# Patient Record
Sex: Female | Born: 1935
Health system: Southern US, Community
[De-identification: ages and names within clinical notes are randomized; demographics above are authoritative.]

## PROBLEM LIST (undated history)

## (undated) DIAGNOSIS — K279 Peptic ulcer, site unspecified, unspecified as acute or chronic, without hemorrhage or perforation: Secondary | ICD-10-CM

## (undated) DIAGNOSIS — M810 Age-related osteoporosis without current pathological fracture: Secondary | ICD-10-CM

## (undated) DIAGNOSIS — E079 Disorder of thyroid, unspecified: Secondary | ICD-10-CM

## (undated) DIAGNOSIS — E039 Hypothyroidism, unspecified: Secondary | ICD-10-CM

## (undated) DIAGNOSIS — R55 Syncope and collapse: Secondary | ICD-10-CM

## (undated) HISTORY — PX: HERNIA REPAIR: SHX51

---

## 1998-10-05 ENCOUNTER — Other Ambulatory Visit: Admission: RE | Admit: 1998-10-05 | Discharge: 1998-10-05 | Payer: Self-pay | Admitting: Obstetrics and Gynecology

## 2000-02-17 ENCOUNTER — Encounter: Payer: Self-pay | Admitting: Obstetrics and Gynecology

## 2000-02-17 ENCOUNTER — Encounter: Admission: RE | Admit: 2000-02-17 | Discharge: 2000-02-17 | Payer: Self-pay | Admitting: Obstetrics and Gynecology

## 2000-03-16 ENCOUNTER — Encounter (INDEPENDENT_AMBULATORY_CARE_PROVIDER_SITE_OTHER): Payer: Self-pay

## 2000-03-16 ENCOUNTER — Other Ambulatory Visit: Admission: RE | Admit: 2000-03-16 | Discharge: 2000-03-16 | Payer: Self-pay | Admitting: Obstetrics and Gynecology

## 2001-03-23 ENCOUNTER — Encounter: Payer: Self-pay | Admitting: Obstetrics and Gynecology

## 2001-03-23 ENCOUNTER — Other Ambulatory Visit: Admission: RE | Admit: 2001-03-23 | Discharge: 2001-03-23 | Payer: Self-pay | Admitting: Obstetrics and Gynecology

## 2001-03-23 ENCOUNTER — Encounter: Admission: RE | Admit: 2001-03-23 | Discharge: 2001-03-23 | Payer: Self-pay | Admitting: Obstetrics and Gynecology

## 2001-12-03 ENCOUNTER — Other Ambulatory Visit: Admission: RE | Admit: 2001-12-03 | Discharge: 2001-12-03 | Payer: Self-pay | Admitting: Obstetrics and Gynecology

## 2002-07-18 ENCOUNTER — Encounter: Payer: Self-pay | Admitting: Obstetrics and Gynecology

## 2002-07-18 ENCOUNTER — Encounter: Admission: RE | Admit: 2002-07-18 | Discharge: 2002-07-18 | Payer: Self-pay | Admitting: Obstetrics and Gynecology

## 2002-08-27 ENCOUNTER — Other Ambulatory Visit: Admission: RE | Admit: 2002-08-27 | Discharge: 2002-08-27 | Payer: Self-pay | Admitting: Obstetrics and Gynecology

## 2003-09-26 ENCOUNTER — Encounter: Admission: RE | Admit: 2003-09-26 | Discharge: 2003-09-26 | Payer: Self-pay | Admitting: Obstetrics and Gynecology

## 2003-09-26 ENCOUNTER — Encounter: Payer: Self-pay | Admitting: Obstetrics and Gynecology

## 2004-01-14 ENCOUNTER — Other Ambulatory Visit: Admission: RE | Admit: 2004-01-14 | Discharge: 2004-01-14 | Payer: Self-pay | Admitting: Obstetrics and Gynecology

## 2004-10-01 ENCOUNTER — Encounter: Admission: RE | Admit: 2004-10-01 | Discharge: 2004-10-01 | Payer: Self-pay | Admitting: Obstetrics and Gynecology

## 2005-12-02 ENCOUNTER — Encounter: Admission: RE | Admit: 2005-12-02 | Discharge: 2005-12-02 | Payer: Self-pay | Admitting: Obstetrics and Gynecology

## 2006-06-15 ENCOUNTER — Other Ambulatory Visit: Admission: RE | Admit: 2006-06-15 | Discharge: 2006-06-15 | Payer: Self-pay | Admitting: Obstetrics and Gynecology

## 2006-12-06 ENCOUNTER — Encounter: Admission: RE | Admit: 2006-12-06 | Discharge: 2006-12-06 | Payer: Self-pay | Admitting: Obstetrics and Gynecology

## 2007-12-10 ENCOUNTER — Encounter: Admission: RE | Admit: 2007-12-10 | Discharge: 2007-12-10 | Payer: Self-pay | Admitting: Obstetrics and Gynecology

## 2008-12-23 ENCOUNTER — Encounter: Admission: RE | Admit: 2008-12-23 | Discharge: 2008-12-23 | Payer: Self-pay | Admitting: Obstetrics and Gynecology

## 2009-12-28 ENCOUNTER — Encounter: Admission: RE | Admit: 2009-12-28 | Discharge: 2009-12-28 | Payer: Self-pay | Admitting: Obstetrics and Gynecology

## 2011-01-06 ENCOUNTER — Encounter
Admission: RE | Admit: 2011-01-06 | Discharge: 2011-01-06 | Payer: Self-pay | Source: Home / Self Care | Attending: Obstetrics and Gynecology | Admitting: Obstetrics and Gynecology

## 2012-01-03 ENCOUNTER — Other Ambulatory Visit: Payer: Self-pay | Admitting: Obstetrics and Gynecology

## 2012-01-03 DIAGNOSIS — Z1231 Encounter for screening mammogram for malignant neoplasm of breast: Secondary | ICD-10-CM

## 2012-01-25 ENCOUNTER — Ambulatory Visit
Admission: RE | Admit: 2012-01-25 | Discharge: 2012-01-25 | Disposition: A | Discharge status: Home / Self Care | Payer: Medicare Other | Source: Ambulatory Visit | Attending: Obstetrics and Gynecology | Admitting: Obstetrics and Gynecology

## 2012-01-25 DIAGNOSIS — Z1231 Encounter for screening mammogram for malignant neoplasm of breast: Secondary | ICD-10-CM

## 2013-05-02 ENCOUNTER — Other Ambulatory Visit: Payer: Self-pay

## 2013-05-02 DIAGNOSIS — Z1231 Encounter for screening mammogram for malignant neoplasm of breast: Secondary | ICD-10-CM

## 2013-05-17 ENCOUNTER — Ambulatory Visit: Payer: Medicare Other

## 2013-06-03 ENCOUNTER — Ambulatory Visit
Admission: RE | Admit: 2013-06-03 | Discharge: 2013-06-03 | Disposition: A | Discharge status: Home / Self Care | Payer: Medicare Other | Source: Ambulatory Visit

## 2013-06-03 DIAGNOSIS — Z1231 Encounter for screening mammogram for malignant neoplasm of breast: Secondary | ICD-10-CM

## 2014-05-06 ENCOUNTER — Other Ambulatory Visit: Payer: Self-pay

## 2014-05-06 DIAGNOSIS — Z1231 Encounter for screening mammogram for malignant neoplasm of breast: Secondary | ICD-10-CM

## 2014-06-09 ENCOUNTER — Ambulatory Visit
Admission: RE | Admit: 2014-06-09 | Discharge: 2014-06-09 | Disposition: A | Discharge status: Home / Self Care | Payer: Medicare Other | Source: Ambulatory Visit

## 2014-06-09 ENCOUNTER — Encounter (INDEPENDENT_AMBULATORY_CARE_PROVIDER_SITE_OTHER): Payer: Self-pay

## 2014-06-09 DIAGNOSIS — Z1231 Encounter for screening mammogram for malignant neoplasm of breast: Secondary | ICD-10-CM

## 2015-06-02 ENCOUNTER — Other Ambulatory Visit: Payer: Self-pay

## 2015-06-02 DIAGNOSIS — Z1231 Encounter for screening mammogram for malignant neoplasm of breast: Secondary | ICD-10-CM

## 2015-07-06 ENCOUNTER — Ambulatory Visit
Admission: RE | Admit: 2015-07-06 | Discharge: 2015-07-06 | Disposition: A | Discharge status: Home / Self Care | Payer: Medicare Other | Source: Ambulatory Visit

## 2015-07-06 DIAGNOSIS — Z1231 Encounter for screening mammogram for malignant neoplasm of breast: Secondary | ICD-10-CM

## 2016-07-01 ENCOUNTER — Other Ambulatory Visit: Payer: Self-pay | Admitting: Registered Nurse

## 2016-07-01 ENCOUNTER — Other Ambulatory Visit: Payer: Self-pay | Admitting: Obstetrics & Gynecology

## 2016-07-01 DIAGNOSIS — Z1231 Encounter for screening mammogram for malignant neoplasm of breast: Secondary | ICD-10-CM

## 2016-08-02 ENCOUNTER — Ambulatory Visit
Admission: RE | Admit: 2016-08-02 | Discharge: 2016-08-02 | Disposition: A | Discharge status: Home / Self Care | Payer: Medicare Other | Source: Ambulatory Visit | Attending: Registered Nurse | Admitting: Registered Nurse

## 2016-08-02 DIAGNOSIS — Z1231 Encounter for screening mammogram for malignant neoplasm of breast: Secondary | ICD-10-CM

## 2016-11-03 ENCOUNTER — Emergency Department (HOSPITAL_COMMUNITY): Payer: Medicare Other

## 2016-11-03 ENCOUNTER — Emergency Department (HOSPITAL_COMMUNITY)
Admission: EM | Admit: 2016-11-03 | Discharge: 2016-11-03 | Disposition: A | Discharge status: Home / Self Care | Payer: Medicare Other | Attending: Emergency Medicine | Admitting: Emergency Medicine

## 2016-11-03 ENCOUNTER — Encounter (HOSPITAL_COMMUNITY): Payer: Self-pay

## 2016-11-03 DIAGNOSIS — R55 Syncope and collapse: Secondary | ICD-10-CM | POA: Insufficient documentation

## 2016-11-03 HISTORY — DX: Disorder of thyroid, unspecified: E07.9

## 2016-11-03 LAB — URINALYSIS, ROUTINE W REFLEX MICROSCOPIC
BILIRUBIN URINE: NEGATIVE
GLUCOSE, UA: NEGATIVE mg/dL
HGB URINE DIPSTICK: NEGATIVE
Ketones, ur: 15 mg/dL — AB
Leukocytes, UA: NEGATIVE
Nitrite: NEGATIVE
Protein, ur: NEGATIVE mg/dL
SPECIFIC GRAVITY, URINE: 1.01 (ref 1.005–1.030)
pH: 6.5 (ref 5.0–8.0)

## 2016-11-03 LAB — CBC WITH DIFFERENTIAL/PLATELET
BASOS ABS: 0.1 10*3/uL (ref 0.0–0.1)
Basophils Relative: 1 %
Eosinophils Absolute: 0.2 10*3/uL (ref 0.0–0.7)
Eosinophils Relative: 3 %
HEMATOCRIT: 39.7 % (ref 36.0–46.0)
HEMOGLOBIN: 13.6 g/dL (ref 12.0–15.0)
LYMPHS PCT: 39 %
Lymphs Abs: 2.4 10*3/uL (ref 0.7–4.0)
MCH: 32.2 pg (ref 26.0–34.0)
MCHC: 34.3 g/dL (ref 30.0–36.0)
MCV: 93.9 fL (ref 78.0–100.0)
Monocytes Absolute: 0.4 10*3/uL (ref 0.1–1.0)
Monocytes Relative: 7 %
NEUTROS ABS: 3.1 10*3/uL (ref 1.7–7.7)
NEUTROS PCT: 50 %
Platelets: 204 10*3/uL (ref 150–400)
RBC: 4.23 MIL/uL (ref 3.87–5.11)
RDW: 13.3 % (ref 11.5–15.5)
WBC: 6.1 10*3/uL (ref 4.0–10.5)

## 2016-11-03 LAB — COMPREHENSIVE METABOLIC PANEL
ALT: 22 U/L (ref 14–54)
AST: 39 U/L (ref 15–41)
Albumin: 3.7 g/dL (ref 3.5–5.0)
Alkaline Phosphatase: 39 U/L (ref 38–126)
Anion gap: 9 (ref 5–15)
BILIRUBIN TOTAL: 0.8 mg/dL (ref 0.3–1.2)
BUN: 14 mg/dL (ref 6–20)
CO2: 22 mmol/L (ref 22–32)
CREATININE: 0.71 mg/dL (ref 0.44–1.00)
Calcium: 9.6 mg/dL (ref 8.9–10.3)
Chloride: 104 mmol/L (ref 101–111)
GFR calc Af Amer: >60 mL/min (ref >60)
GLUCOSE: 125 mg/dL — AB (ref 65–99)
Potassium: 3.7 mmol/L (ref 3.5–5.1)
Sodium: 135 mmol/L (ref 135–145)
TOTAL PROTEIN: 6.2 g/dL — AB (ref 6.5–8.1)

## 2016-11-03 LAB — CBG MONITORING, ED: GLUCOSE-CAPILLARY: 105 mg/dL — AB (ref 65–99)

## 2016-11-03 MED ORDER — SODIUM CHLORIDE 0.9 % IV BOLUS (SEPSIS)
1000.0000 mL | Freq: Once | INTRAVENOUS | Status: AC
  Administered 2016-11-03: 1000 mL via INTRAVENOUS

## 2016-11-03 NOTE — ED Notes (Signed)
Fluids placed on pump because pt intermittently cuts off fluids with movement.

## 2016-11-03 NOTE — ED Triage Notes (Signed)
Pt arrives EMS from church where she was helping with meal. Pt became hot and nauseated, sat down and passed out. Loss of continence. Pt c/o continued nausea but denies pain.

## 2016-11-03 NOTE — ED Provider Notes (Signed)
[facility]-EMERGENCY DEPT Provider Note   CSN: [id number] Arrival date & time: 11/03/16  1027     History   Chief Complaint Chief Complaint  Patient presents with  . Loss of Consciousness    HPI Cheryl Schneider is a 80 y.o. female.   Loss of Consciousness   This is a recurrent problem. The current episode started less than 1 hour ago. The problem occurs rarely. The problem has been resolved. She lost consciousness for a period of less than one minute. The problem is associated with standing up (heat). Associated symptoms include diaphoresis, light-headedness and nausea. Pertinent negatives include chest pain, confusion, dizziness, headaches, palpitations, seizures, visual change and weakness. She has tried nothing for the symptoms.    Past Medical History:  Diagnosis Date  . Thyroid disease     There are no active problems to display for this patient.   Past Surgical History:  Procedure Laterality Date  . HERNIA REPAIR      OB History    No data available       Home Medications    Prior to Admission medications   Medication Sig Start Date End Date Taking? Authorizing Provider  calcium-vitamin D (OSCAL WITH D) 500-200 MG-UNIT tablet Take 3 tablets by mouth daily with breakfast.   Yes Historical Provider, MD  Coenzyme Q10 (COQ-10) 10 MG CAPS Take 1 tablet by mouth 2 (two) times daily.   Yes Historical Provider, MD  cyanocobalamin 1000 MCG tablet Take 1,000 mcg by mouth daily.   Yes Historical Provider, MD  Garlic 100 MG TABS Take 100 mg by mouth daily.   Yes Historical Provider, MD  LEVOTHYROXINE SODIUM PO Take by mouth. Dose unknown. Pharmacy closed due to holiday.   Yes Historical Provider, MD  Multiple Vitamins-Minerals (MULTIVITAMIN WITH MINERALS) tablet Take 2 tablets by mouth daily.   Yes Historical Provider, MD  Omega-3 Fatty Acids (FISH OIL) 1000 MG CPDR Take 1,000 mg by mouth daily.   Yes Historical Provider, MD    Family History No family history on  file.  Social History Social History  Substance Use Topics  . Smoking status: Never Smoker  . Smokeless tobacco: Never Used  . Alcohol use No     Allergies   Patient has no known allergies.   Review of Systems Review of Systems  Constitutional: Positive for diaphoresis.  Respiratory: Negative for cough and shortness of breath.   Cardiovascular: Positive for syncope. Negative for chest pain and palpitations.  Gastrointestinal: Positive for nausea.  Neurological: Positive for light-headedness. Negative for dizziness, seizures, speech difficulty, weakness, numbness and headaches.  Psychiatric/Behavioral: Negative for confusion.  All other systems reviewed and are negative.    Physical Exam Updated Vital Signs BP 134/58   Pulse 71   Temp 98 F (36.7 C) (Oral)   Resp 14   Ht 5\' 5"  (1.651 m)   Wt 173 lb (78.5 kg)   SpO2 100%   BMI 28.79 kg/m   Physical Exam  Constitutional: She is oriented to person, place, and time. She appears well-developed and well-nourished.  HENT:  Head: Normocephalic and atraumatic.  Neck: Normal range of motion.  Cardiovascular: Normal rate and regular rhythm.  Exam reveals no gallop and no friction rub.   No murmur heard. Pulmonary/Chest: No stridor. No respiratory distress.  Abdominal: Soft. She exhibits no distension. There is no tenderness.  Musculoskeletal: Normal range of motion. She exhibits no edema or deformity.  Neurological: She is alert and oriented to person, place, and  time.  No altered mental status, able to give full seemingly accurate history.  Face is symmetric, EOM's intact, pupils equal and reactive, vision intact, tongue and uvula midline without deviation Upper and Lower extremity motor 5/5, intact pain perception in distal extremities, 2+ reflexes in biceps, patella and achilles tendons. Finger to nose normal, heel to shin normal. Walks without assistance or evident ataxia.   Skin: Skin is warm and dry.  Nursing note and  vitals reviewed.    ED Treatments / Results  Labs (all labs ordered are listed, but only abnormal results are displayed) Labs Reviewed  COMPREHENSIVE METABOLIC PANEL - Abnormal; Notable for the following:       Result Value   Glucose, Bld 125 (*)    Total Protein 6.2 (*)    All other components within normal limits  URINALYSIS, ROUTINE W REFLEX MICROSCOPIC (NOT AT [facility]) - Abnormal; Notable for the following:    Ketones, ur 15 (*)    All other components within normal limits  CBG MONITORING, ED - Abnormal; Notable for the following:    Glucose-Capillary 105 (*)    All other components within normal limits  CBC WITH DIFFERENTIAL/PLATELET  POCT CBG (FASTING - GLUCOSE)-MANUAL ENTRY    EKG  EKG Interpretation  Date/Time:  Thursday November 03 2016 10:36:00 EST Ventricular Rate:  67 PR Interval:    QRS Duration: 96 QT Interval:  403 QTC Calculation: 426 R Axis:   42 Text Interpretation:  Sinus rhythm No old tracing to compare Confirmed by [facility] Hospital[facility]ES[facility] MD, Sohail Capraro [phone]([id number]) on 11/03/2016 10:39:39 AM       Radiology Dg Chest 2 View  Result Date: 11/03/2016 CLINICAL DATA:  Syncopal episode.  Weakness. EXAM: CHEST  2 VIEW COMPARISON:  None. FINDINGS: Normal cardiac silhouette and mediastinal contours. Evaluation of the retrosternal clear space obscured secondary to overlying soft tissues. No focal airspace opacities. No pleural effusion or pneumothorax. No evidence of edema. No acute osseus abnormalities. IMPRESSION: No acute cardiopulmonary disease. Electronically Signed   By: Simonne ComeJohn  Watts M.D.   On: 11/03/2016 11:16   Ct Head Wo Contrast  Result Date: 11/03/2016 CLINICAL DATA:  Syncopal episode today. Denies hitting her head, states she sat down before passing out. No complaint of pain, just having nausea EXAM: CT HEAD WITHOUT CONTRAST TECHNIQUE: Contiguous axial images were obtained from the base of the skull through the vertex without intravenous contrast. COMPARISON:  None. FINDINGS:  Brain: No acute intracranial hemorrhage. No focal mass lesion. No CT evidence of acute infarction. No midline shift or mass effect. No hydrocephalus. Basilar cisterns are patent. Vascular: No hyperdense vessel or unexpected calcification. Skull: Normal. Negative for fracture or focal lesion. Sinuses/Orbits: No acute finding. Other: None. IMPRESSION: No acute intracranial findings.  No evidence trauma. Electronically Signed   By: Genevive BiStewart  Edmunds M.D.   On: 11/03/2016 11:21    Procedures Procedures (including critical care time)  Medications Ordered in ED Medications  sodium chloride 0.9 % bolus 1,000 mL (0 mLs Intravenous Stopped 11/03/16 1449)  sodium chloride 0.9 % bolus 1,000 mL (0 mLs Intravenous Stopped 11/03/16 1506)     Initial Impression / Assessment and Plan / ED Course  I have reviewed the triage vital signs and the nursing notes.  Pertinent labs & imaging results that were available during my care of the patient were reviewed by me and considered in my medical decision making (see chart for details).  Clinical Course     80 yo F presented with syncope lasting approximately  a minute and associated with light headedness, sweating while working in a kitchen. Pertinent physical exam findings include orthostatic symptoms and pertinent labs and imaging revealed no significant findings . This is most consistent with hypovolemic and vasovagal syncopal episode. Also on the differential diagnosis was seizure, TIA/Stroke, hypoglycemia, narcolepsy. Doubt the patient had a seizure without a history of seizure, no post-ictal state or incontinence. history not consistent with stroke or TIA. Hypoglycemia unlikely as patient improved without intervention and had normal glucose. For syncope, there are many possible causes however based on history and physical I feel like cardiac causes are unlikely with normal ECG (as read above), no new murmurs and no evidence of arrhythmias while on prolonged (4  hours) continuous monitoring here. Patient not hypotensive here and upon review of the records there are no drugs/medical problems that would point toward this as a cause. Unsure of actual cause of syncope, however I think it is most consistent with a vasovagal (and hypovolemic) type of syncope and emergent causes are unlikely at this time and they can continue workup as an outpatient with PCP.   Final Clinical Impressions(s) / ED Diagnoses   Final diagnoses:  Syncope and collapse    New Prescriptions Discharge Medication List as of 11/03/2016  2:56 PM       Marily MemosJason Stepanie Graver, MD 11/05/16 [phone]

## 2016-11-03 NOTE — ED Notes (Signed)
Pt ambulated good with a normal gait.

## 2017-07-24 ENCOUNTER — Other Ambulatory Visit: Payer: Self-pay | Admitting: Registered Nurse

## 2017-07-24 DIAGNOSIS — Z1231 Encounter for screening mammogram for malignant neoplasm of breast: Secondary | ICD-10-CM

## 2017-08-04 ENCOUNTER — Ambulatory Visit
Admission: RE | Admit: 2017-08-04 | Discharge: 2017-08-04 | Disposition: A | Discharge status: Home / Self Care | Payer: Medicare Other | Source: Ambulatory Visit | Attending: Registered Nurse | Admitting: Registered Nurse

## 2017-08-04 DIAGNOSIS — Z1231 Encounter for screening mammogram for malignant neoplasm of breast: Secondary | ICD-10-CM

## 2018-05-01 ENCOUNTER — Inpatient Hospital Stay (HOSPITAL_COMMUNITY)
Admission: EM | Admit: 2018-05-01 | Discharge: 2018-05-03 | DRG: 378 | Disposition: A | Discharge status: Home / Self Care | Payer: Medicare Other | Attending: Internal Medicine | Admitting: Internal Medicine

## 2018-05-01 ENCOUNTER — Encounter (HOSPITAL_COMMUNITY): Payer: Self-pay | Admitting: Emergency Medicine

## 2018-05-01 ENCOUNTER — Other Ambulatory Visit: Payer: Self-pay

## 2018-05-01 DIAGNOSIS — K279 Peptic ulcer, site unspecified, unspecified as acute or chronic, without hemorrhage or perforation: Secondary | ICD-10-CM | POA: Diagnosis not present

## 2018-05-01 DIAGNOSIS — D62 Acute posthemorrhagic anemia: Secondary | ICD-10-CM | POA: Diagnosis present

## 2018-05-01 DIAGNOSIS — K2971 Gastritis, unspecified, with bleeding: Secondary | ICD-10-CM | POA: Diagnosis not present

## 2018-05-01 DIAGNOSIS — D649 Anemia, unspecified: Secondary | ICD-10-CM

## 2018-05-01 DIAGNOSIS — M810 Age-related osteoporosis without current pathological fracture: Secondary | ICD-10-CM

## 2018-05-01 DIAGNOSIS — R55 Syncope and collapse: Secondary | ICD-10-CM | POA: Diagnosis present

## 2018-05-01 DIAGNOSIS — Z7983 Long term (current) use of bisphosphonates: Secondary | ICD-10-CM

## 2018-05-01 DIAGNOSIS — K2901 Acute gastritis with bleeding: Secondary | ICD-10-CM | POA: Diagnosis present

## 2018-05-01 DIAGNOSIS — Z79890 Hormone replacement therapy (postmenopausal): Secondary | ICD-10-CM

## 2018-05-01 DIAGNOSIS — K922 Gastrointestinal hemorrhage, unspecified: Secondary | ICD-10-CM | POA: Diagnosis present

## 2018-05-01 DIAGNOSIS — E039 Hypothyroidism, unspecified: Secondary | ICD-10-CM | POA: Diagnosis present

## 2018-05-01 HISTORY — DX: Syncope and collapse: R55

## 2018-05-01 HISTORY — DX: Hypothyroidism, unspecified: E03.9

## 2018-05-01 HISTORY — DX: Age-related osteoporosis without current pathological fracture: M81.0

## 2018-05-01 HISTORY — DX: Peptic ulcer, site unspecified, unspecified as acute or chronic, without hemorrhage or perforation: K27.9

## 2018-05-01 LAB — CBC
HEMATOCRIT: 24.1 % — AB (ref 36.0–46.0)
HEMOGLOBIN: 8.4 g/dL — AB (ref 12.0–15.0)
MCH: 32.8 pg (ref 26.0–34.0)
MCHC: 34.9 g/dL (ref 30.0–36.0)
MCV: 94.1 fL (ref 78.0–100.0)
Platelets: 209 10*3/uL (ref 150–400)
RBC: 2.56 MIL/uL — ABNORMAL LOW (ref 3.87–5.11)
RDW: 13.5 % (ref 11.5–15.5)
WBC: 9.2 10*3/uL (ref 4.0–10.5)

## 2018-05-01 LAB — URINALYSIS, ROUTINE W REFLEX MICROSCOPIC
BACTERIA UA: NONE SEEN
BILIRUBIN URINE: NEGATIVE
GLUCOSE, UA: NEGATIVE mg/dL
HGB URINE DIPSTICK: NEGATIVE
Ketones, ur: 20 mg/dL — AB
NITRITE: NEGATIVE
PROTEIN: NEGATIVE mg/dL
Specific Gravity, Urine: 1.024 (ref 1.005–1.030)
pH: 5 (ref 5.0–8.0)

## 2018-05-01 LAB — CBG MONITORING, ED: Glucose-Capillary: 106 mg/dL — ABNORMAL HIGH (ref 65–99)

## 2018-05-01 LAB — BASIC METABOLIC PANEL
Anion gap: 7 (ref 5–15)
BUN: 33 mg/dL — ABNORMAL HIGH (ref 6–20)
CO2: 22 mmol/L (ref 22–32)
Calcium: 8.3 mg/dL — ABNORMAL LOW (ref 8.9–10.3)
Chloride: 104 mmol/L (ref 101–111)
Creatinine, Ser: 0.53 mg/dL (ref 0.44–1.00)
GFR calc Af Amer: >60 mL/min (ref >60)
GFR calc non Af Amer: >60 mL/min (ref >60)
Glucose, Bld: 117 mg/dL — ABNORMAL HIGH (ref 65–99)
Potassium: 3.8 mmol/L (ref 3.5–5.1)
Sodium: 133 mmol/L — ABNORMAL LOW (ref 135–145)

## 2018-05-01 LAB — HEMOGLOBIN AND HEMATOCRIT, BLOOD
HCT: 20.2 % — ABNORMAL LOW (ref 36.0–46.0)
HEMOGLOBIN: 7.1 g/dL — AB (ref 12.0–15.0)

## 2018-05-01 LAB — POC OCCULT BLOOD, ED: Fecal Occult Bld: POSITIVE — AB

## 2018-05-01 LAB — ABO/RH: ABO/RH(D): A POS

## 2018-05-01 MED ORDER — OMEGA-3-ACID ETHYL ESTERS 1 G PO CAPS
1.0000 g | ORAL_CAPSULE | Freq: Two times a day (BID) | ORAL | Status: DC
  Administered 2018-05-02 – 2018-05-03 (×3): 1 g via ORAL
  Filled 2018-05-01 (×4): qty 1

## 2018-05-01 MED ORDER — SODIUM CHLORIDE 0.9 % IV BOLUS
500.0000 mL | Freq: Once | INTRAVENOUS | Status: AC
  Administered 2018-05-01: 500 mL via INTRAVENOUS

## 2018-05-01 MED ORDER — ONDANSETRON HCL 4 MG PO TABS: 4.0000 mg | ORAL_TABLET | Freq: Four times a day (QID) | ORAL | Status: DC | PRN

## 2018-05-01 MED ORDER — VITAMIN B-12 1000 MCG PO TABS
1000.0000 ug | ORAL_TABLET | Freq: Every day | ORAL | Status: DC
  Administered 2018-05-02 – 2018-05-03 (×2): 1000 ug via ORAL
  Filled 2018-05-01 (×2): qty 1

## 2018-05-01 MED ORDER — SODIUM CHLORIDE 0.9 % IV SOLN
INTRAVENOUS | Status: DC
  Administered 2018-05-02 – 2018-05-03 (×2): via INTRAVENOUS

## 2018-05-01 MED ORDER — ACETAMINOPHEN 325 MG PO TABS
650.0000 mg | ORAL_TABLET | Freq: Four times a day (QID) | ORAL | Status: DC | PRN
  Administered 2018-05-01: 650 mg via ORAL
  Filled 2018-05-01: qty 2

## 2018-05-01 MED ORDER — TRAMADOL HCL 50 MG PO TABS: 50.0000 mg | ORAL_TABLET | Freq: Four times a day (QID) | ORAL | Status: DC | PRN

## 2018-05-01 MED ORDER — ONDANSETRON HCL 4 MG/2ML IJ SOLN
4.0000 mg | Freq: Four times a day (QID) | INTRAMUSCULAR | Status: DC | PRN
  Administered 2018-05-02: 4 mg via INTRAVENOUS

## 2018-05-01 MED ORDER — POLYETHYLENE GLYCOL 3350 17 G PO PACK: 17.0000 g | PACK | Freq: Every day | ORAL | Status: DC | PRN

## 2018-05-01 MED ORDER — COQ-10 10 MG PO CAPS: 1.0000 | ORAL_CAPSULE | Freq: Two times a day (BID) | ORAL | Status: DC

## 2018-05-01 MED ORDER — SODIUM CHLORIDE 0.9 % IV SOLN: INTRAVENOUS | Status: DC

## 2018-05-01 MED ORDER — MELATONIN 3 MG PO TABS
1.0000 | ORAL_TABLET | Freq: Every day | ORAL | Status: DC
  Administered 2018-05-01: 3 mg via ORAL
  Filled 2018-05-01 (×2): qty 1

## 2018-05-01 MED ORDER — CALCIUM CARBONATE-VITAMIN D 500-200 MG-UNIT PO TABS
3.0000 | ORAL_TABLET | Freq: Every day | ORAL | Status: DC
  Administered 2018-05-02 – 2018-05-03 (×2): 3 via ORAL
  Filled 2018-05-01 (×2): qty 3

## 2018-05-01 MED ORDER — FAMOTIDINE IN NACL 20-0.9 MG/50ML-% IV SOLN
20.0000 mg | Freq: Two times a day (BID) | INTRAVENOUS | Status: DC
  Administered 2018-05-01: 20 mg via INTRAVENOUS
  Filled 2018-05-01: qty 50

## 2018-05-01 MED ORDER — ADULT MULTIVITAMIN W/MINERALS CH
2.0000 | ORAL_TABLET | Freq: Every day | ORAL | Status: DC
Start: 2018-05-02 — End: 2018-05-02
  Filled 2018-05-01: qty 2

## 2018-05-01 MED ORDER — LEVOTHYROXINE SODIUM 50 MCG PO TABS
50.0000 ug | ORAL_TABLET | Freq: Every day | ORAL | Status: DC
  Administered 2018-05-02 – 2018-05-03 (×2): 50 ug via ORAL
  Filled 2018-05-01 (×2): qty 1

## 2018-05-01 MED ORDER — ACETAMINOPHEN 650 MG RE SUPP: 650.0000 mg | Freq: Four times a day (QID) | RECTAL | Status: DC | PRN

## 2018-05-01 MED ORDER — GARLIC 100 MG PO TABS: 100.0000 mg | ORAL_TABLET | Freq: Every day | ORAL | Status: DC

## 2018-05-01 NOTE — ED Notes (Signed)
ED TO INPATIENT HANDOFF REPORT  Name/Age/Gender Cheryl Schneider 82 y.o. female  Code Status Advance Directive Documentation     Most Recent Value  Type of Advance Directive  Healthcare Power of Attorney, Living will  Pre-existing out of facility DNR order (yellow form or pink MOST form)  -  "MOST" Form in Place?  -      Home/SNF/Other Home  Chief Complaint Syncope   Level of Care/Admitting Diagnosis ED Disposition    ED Disposition Condition [place]: [place] [[id number]]  Level of Care: Telemetry [5]  Admit to tele based on following criteria: Eval of Syncope  Diagnosis: GIB (gastrointestinal bleeding) [256389]  Admitting Physician: Cristy Folks [[id number]]  Attending Physician: Cristy Folks [[id number]]  Estimated length of stay: past midnight tomorrow  Certification:: I certify this patient will need inpatient services for at least 2 midnights  PT Class (Do Not Modify): Inpatient [101]  PT Acc Code (Do Not Modify): Private [1]       Medical History Past Medical History:  Diagnosis Date  . Hypothyroidism 05/01/2018  . Osteoporosis 05/01/2018  . Peptic ulcer disease 05/01/2018  . Syncope 05/01/2018  . Thyroid disease     Allergies No Known Allergies  IV Location/Drains/Wounds Patient Lines/Drains/Airways Status   Active Line/Drains/Airways    Name:   Placement date:   Placement time:   Site:   Days:   Peripheral IV 05/01/18 Left Antecubital   05/01/18    1359    Antecubital   less than 1   Peripheral IV 05/01/18 Right Forearm   05/01/18    1643    Forearm   less than 1          Labs/Imaging Results for orders placed or performed during the hospital encounter of 05/01/18 (from the past 48 hour(s))  Basic metabolic panel     Status: Abnormal   Collection Time: 05/01/18  2:12 PM  Result Value Ref Range   Sodium 133 (L) 135 - 145 mmol/L   Potassium 3.8 3.5 - 5.1 mmol/L   Chloride 104 101 - 111 mmol/L   CO2 22 22 -  32 mmol/L   Glucose, Bld 117 (H) 65 - 99 mg/dL   BUN 33 (H) 6 - 20 mg/dL   Creatinine, Ser 0.53 0.44 - 1.00 mg/dL   Calcium 8.3 (L) 8.9 - 10.3 mg/dL   GFR calc non Af Amer >60 >60 mL/min   GFR calc Af Amer >60 >60 mL/min    Comment: (NOTE) The eGFR has been calculated using the CKD EPI equation. This calculation has not been validated in all clinical situations. eGFR's persistently <60 mL/min signify possible Chronic Kidney Disease.    Anion gap 7 5 - 15    Comment: Performed at [facility], [place] [address], [place], [place] [address]  CBC     Status: Abnormal   Collection Time: 05/01/18  2:12 PM  Result Value Ref Range   WBC 9.2 4.0 - 10.5 K/uL   RBC 2.56 (L) 3.87 - 5.11 MIL/uL   Hemoglobin 8.4 (L) 12.0 - 15.0 g/dL   HCT 24.1 (L) 36.0 - 46.0 %   MCV 94.1 78.0 - 100.0 fL   MCH 32.8 26.0 - 34.0 pg   MCHC 34.9 30.0 - 36.0 g/dL   RDW 13.5 11.5 - 15.5 %   Platelets 209 150 - 400 K/uL    Comment: Performed at [facility], [place]  [address] [place], [place] [address]  CBG monitoring, ED     Status: Abnormal   Collection Time: 05/01/18  2:18 PM  Result Value Ref Range   Glucose-Capillary 106 (H) 65 - 99 mg/dL  POC occult blood, ED Provider will collect     Status: Abnormal   Collection Time: 05/01/18  4:04 PM  Result Value Ref Range   Fecal Occult Bld POSITIVE (A) NEGATIVE  Urinalysis, Routine w reflex microscopic     Status: Abnormal   Collection Time: 05/01/18  4:11 PM  Result Value Ref Range   Color, Urine AMBER (A) YELLOW    Comment: BIOCHEMICALS MAY BE AFFECTED BY COLOR   APPearance HAZY (A) CLEAR   Specific Gravity, Urine 1.024 1.005 - 1.030   pH 5.0 5.0 - 8.0   Glucose, UA NEGATIVE NEGATIVE mg/dL   Hgb urine dipstick NEGATIVE NEGATIVE   Bilirubin Urine NEGATIVE NEGATIVE   Ketones, ur 20 (A) NEGATIVE mg/dL   Protein, ur NEGATIVE NEGATIVE mg/dL   Nitrite NEGATIVE NEGATIVE   Leukocytes, UA TRACE (A) NEGATIVE   RBC / HPF 0-5 0 - 5  RBC/hpf   WBC, UA 0-5 0 - 5 WBC/hpf   Bacteria, UA NONE SEEN NONE SEEN    Comment: Performed at [facility], [place] [address] [place], [place] [address]  Type and screen [place]     Status: None   Collection Time: 05/01/18  4:43 PM  Result Value Ref Range   ABO/RH(D) A POS    Antibody Screen NEG    Sample Expiration      05/04/2018 Performed at [facility], [place] [address], [place], [place] [address]    No results found.  Pending Labs Unresulted Labs (From admission, onward)   Start     Ordered   05/01/18 1643  ABO/Rh  Once,   R     05/01/18 1643   Signed and Held  Basic metabolic panel  Tomorrow morning,   R     Signed and Held   Signed and Held  CBC  Tomorrow morning,   R     Signed and Held   Signed and Held  Hemoglobin and hematocrit, blood  Now then every 6 hours,   R     Signed and Held      Vitals/Pain Today's Vitals   05/01/18 1531 05/01/18 1600 05/01/18 1630 05/01/18 1730  BP: (!) 109/55 124/67 113/60 (!) 107/58  Pulse: 73 83 78 76  Resp: _0 SpO2: 100% 100% 100% 100%  PainSc:        Isolation Precautions No active isolations  Medications Medications  famotidine (PEPCID) IVPB 20 mg premix (0 mg Intravenous Stopped 05/01/18 1737)  sodium chloride 0.9 % bolus 500 mL (0 mLs Intravenous Stopped 05/01/18 1621)    Mobility walks

## 2018-05-01 NOTE — ED Notes (Signed)
Report given to Jenifer RN at 4E @ 1750.

## 2018-05-01 NOTE — ED Triage Notes (Addendum)
Per EMS-states she was at church-wasn't feeling good-had a witnessed syncopal episode which lasted 30 seconds-states she was playing cards at the time-CBG 79, BP 136/90, HR 80-4 mg of Zofran IV given in route-patient states she feels completely normal now

## 2018-05-01 NOTE — Consult Note (Signed)
Subjective:   HPI  The patient is a 82 year old female who presented to the emergency room with complaints of syncope.  Several days ago when she was at church she was feeling woozy and lightheaded but did not pass out.  Today she felt progressively lightheaded and passed out.  She was found to have a hemoglobin of 8.4 and heme positive stool.  She did vomit a little dark-colored coffee-ground appearing material today.  Denies vomiting bright red blood.  She has not really noticed melena but says a few days ago Cheryl Schneider stool was a little dark but she ate some collard's and thought it was due to that.  She does have a remote history of some gastric antral ulcerations in 2006.  She has seen Dr. Matthias Hughs in 2006 who did a follow-up endoscopy which did not reveal anything significant and she also had a colonoscopy in 2006 which was unremarkable.  At this time she is stable and feels fine.  She is going to be admitted to the hospital by the hospitalist service.  She denies taking aspirin or NSAIDs.  She denies abdominal pain or heartburn.  Review of Systems No chest pain or shortness of breath  Past Medical History:  Diagnosis Date  . Hypothyroidism 05/01/2018  . Osteoporosis 05/01/2018  . Peptic ulcer disease 05/01/2018  . Syncope 05/01/2018  . Thyroid disease    Past Surgical History:  Procedure Laterality Date  . HERNIA REPAIR     Social History   Socioeconomic History  . Marital status: Married    Spouse name: Not on file  . Number of children: Not on file  . Years of education: Not on file  . Highest education level: Not on file  Occupational History  . Not on file  Social Needs  . Financial resource strain: Not on file  . Food insecurity:    Worry: Not on file    Inability: Not on file  . Transportation needs:    Medical: Not on file    Non-medical: Not on file  Tobacco Use  . Smoking status: Never Smoker  . Smokeless tobacco: Never Used  Substance and Sexual Activity  . Alcohol  use: No  . Drug use: No  . Sexual activity: Not on file  Lifestyle  . Physical activity:    Days per week: Not on file    Minutes per session: Not on file  . Stress: Not on file  Relationships  . Social connections:    Talks on phone: Not on file    Gets together: Not on file    Attends religious service: Not on file    Active member of club or organization: Not on file    Attends meetings of clubs or organizations: Not on file    Relationship status: Not on file  . Intimate partner violence:    Fear of current or ex partner: Not on file    Emotionally abused: Not on file    Physically abused: Not on file    Forced sexual activity: Not on file  Other Topics Concern  . Not on file  Social History Narrative  . Not on file   family history is not on file.  Current Facility-Administered Medications:  .  famotidine (PEPCID) IVPB 20 mg premix, 20 mg, Intravenous, Q12H, Ford, Kelsey N, PA-C, Stopped at 05/01/18 1737  Current Outpatient Medications:  .  alendronate (FOSAMAX) 70 MG tablet, Take 1 tablet by mouth once a week. Tuesdays, Disp: , Rfl: 1 .  calcium-vitamin D (OSCAL WITH D) 500-200 MG-UNIT tablet, Take 3 tablets by mouth daily with breakfast., Disp: , Rfl:  .  Coenzyme Q10 (COQ-10) 10 MG CAPS, Take 1 tablet by mouth 2 (two) times daily., Disp: , Rfl:  .  cyanocobalamin 1000 MCG tablet, Take 1,000 mcg by mouth daily., Disp: , Rfl:  .  Garlic 100 MG TABS, Take 100 mg by mouth daily., Disp: , Rfl:  .  levothyroxine (SYNTHROID, LEVOTHROID) 50 MCG tablet, Take 1 tablet by mouth daily. Dose unknown. Pharmacy closed due to holiday. , Disp: , Rfl:  .  Melatonin 3 MG TABS, Take 1 tablet by mouth at bedtime., Disp: , Rfl:  .  Multiple Vitamins-Minerals (MULTIVITAMIN WITH MINERALS) tablet, Take 2 tablets by mouth daily., Disp: , Rfl:  .  Omega-3 Fatty Acids (FISH OIL) 1000 MG CPDR, Take 1,000 mg by mouth daily., Disp: , Rfl:  No Known Allergies   Objective:     BP (!) 107/58    Pulse 76   Resp 15   SpO2 100%   Alert and oriented  No acute distress  Heart regular rhythm no murmurs  Lungs clear  Abdomen: Bowel sounds normal, soft, nontender, no hepatosplenomegaly  Laboratory No components found for: D1    Assessment:     Upper GI bleed.  This sounds like it has been occurring over the last few days.  Anemia secondary to blood loss      Plan:     Patient will be admitted to the hospitalist.  Transfuse blood as needed.  H2-blocker for acid reduction will be given since there is a shortage of pantoprazole.  Proceed with EGD tomorrow.

## 2018-05-01 NOTE — H&P (View-Only) (Signed)
Subjective:   HPI  The patient is a 82-year-old female who presented to the emergency room with complaints of syncope.  Several days ago when she was at church she was feeling woozy and lightheaded but did not pass out.  Today she felt progressively lightheaded and passed out.  She was found to have a hemoglobin of 8.4 and heme positive stool.  She did vomit a little dark-colored coffee-ground appearing material today.  Denies vomiting bright red blood.  She has not really noticed melena but says a few days ago her stool was a little dark but she ate some collard's and thought it was due to that.  She does have a remote history of some gastric antral ulcerations in 2006.  She has seen Dr. Buccini in 2006 who did a follow-up endoscopy which did not reveal anything significant and she also had a colonoscopy in 2006 which was unremarkable.  At this time she is stable and feels fine.  She is going to be admitted to the hospital by the hospitalist service.  She denies taking aspirin or NSAIDs.  She denies abdominal pain or heartburn.  Review of Systems No chest pain or shortness of breath  Past Medical History:  Diagnosis Date  . Hypothyroidism 05/01/2018  . Osteoporosis 05/01/2018  . Peptic ulcer disease 05/01/2018  . Syncope 05/01/2018  . Thyroid disease    Past Surgical History:  Procedure Laterality Date  . HERNIA REPAIR     Social History   Socioeconomic History  . Marital status: Married    Spouse name: Not on file  . Number of children: Not on file  . Years of education: Not on file  . Highest education level: Not on file  Occupational History  . Not on file  Social Needs  . Financial resource strain: Not on file  . Food insecurity:    Worry: Not on file    Inability: Not on file  . Transportation needs:    Medical: Not on file    Non-medical: Not on file  Tobacco Use  . Smoking status: Never Smoker  . Smokeless tobacco: Never Used  Substance and Sexual Activity  . Alcohol  use: No  . Drug use: No  . Sexual activity: Not on file  Lifestyle  . Physical activity:    Days per week: Not on file    Minutes per session: Not on file  . Stress: Not on file  Relationships  . Social connections:    Talks on phone: Not on file    Gets together: Not on file    Attends religious service: Not on file    Active member of club or organization: Not on file    Attends meetings of clubs or organizations: Not on file    Relationship status: Not on file  . Intimate partner violence:    Fear of current or ex partner: Not on file    Emotionally abused: Not on file    Physically abused: Not on file    Forced sexual activity: Not on file  Other Topics Concern  . Not on file  Social History Narrative  . Not on file   family history is not on file.  Current Facility-Administered Medications:  .  famotidine (PEPCID) IVPB 20 mg premix, 20 mg, Intravenous, Q12H, Ford, Kelsey N, PA-C, Stopped at 05/01/18 1737  Current Outpatient Medications:  .  alendronate (FOSAMAX) 70 MG tablet, Take 1 tablet by mouth once a week. Tuesdays, Disp: , Rfl: 1 .    calcium-vitamin D (OSCAL WITH D) 500-200 MG-UNIT tablet, Take 3 tablets by mouth daily with breakfast., Disp: , Rfl:  .  Coenzyme Q10 (COQ-10) 10 MG CAPS, Take 1 tablet by mouth 2 (two) times daily., Disp: , Rfl:  .  cyanocobalamin 1000 MCG tablet, Take 1,000 mcg by mouth daily., Disp: , Rfl:  .  Garlic 100 MG TABS, Take 100 mg by mouth daily., Disp: , Rfl:  .  levothyroxine (SYNTHROID, LEVOTHROID) 50 MCG tablet, Take 1 tablet by mouth daily. Dose unknown. Pharmacy closed due to holiday. , Disp: , Rfl:  .  Melatonin 3 MG TABS, Take 1 tablet by mouth at bedtime., Disp: , Rfl:  .  Multiple Vitamins-Minerals (MULTIVITAMIN WITH MINERALS) tablet, Take 2 tablets by mouth daily., Disp: , Rfl:  .  Omega-3 Fatty Acids (FISH OIL) 1000 MG CPDR, Take 1,000 mg by mouth daily., Disp: , Rfl:  No Known Allergies   Objective:     BP (!) 107/58    Pulse 76   Resp 15   SpO2 100%   Alert and oriented  No acute distress  Heart regular rhythm no murmurs  Lungs clear  Abdomen: Bowel sounds normal, soft, nontender, no hepatosplenomegaly  Laboratory No components found for: D1    Assessment:     Upper GI bleed.  This sounds like it has been occurring over the last few days.  Anemia secondary to blood loss      Plan:     Patient will be admitted to the hospitalist.  Transfuse blood as needed.  H2-blocker for acid reduction will be given since there is a shortage of pantoprazole.  Proceed with EGD tomorrow.

## 2018-05-01 NOTE — ED Notes (Signed)
Pt is aware of urine sample. Will let us know when she needs to use the restroom.

## 2018-05-01 NOTE — Plan of Care (Signed)

## 2018-05-01 NOTE — Progress Notes (Signed)
PHARMACIST - PHYSICIAN ORDER COMMUNICATION  CONCERNING: P&T Medication Policy on Herbal Medications  DESCRIPTION:  This patient's order ZOX:[id number] and Co-Q 10   has been noted.  This product(s) is classified as an "herbal" or natural product. Due to a lack of definitive safety studies or FDA approval, nonstandard manufacturing practices, plus the potential risk of unknown drug-drug interactions while on inpatient medications, the Pharmacy and Therapeutics Committee does not permit the use of "herbal" or natural products of this type within [facility].   ACTION TAKEN: The pharmacy department is unable to verify this order at this time and your patient has been informed of this safety policy. Please reevaluate patient's clinical condition at discharge and address if the herbal or natural product(s) should be resumed at that time.

## 2018-05-01 NOTE — H&P (Signed)
History and Physical    Cheryl Schneider ZOX:[id number] DOB: February 27, 1936 DOA: 05/01/2018  PCP: Fatima Sanger, FNP   Patient coming from: home    Chief Complaint: syncope  HPI: Cheryl Schneider is a 82 y.o. female with medical history significant of hypothyroidism, peptic ulcer disease, osteoporosis who comes in with syncopal episode.  Patient reports she had been feeling presyncopal for the last 2 to 3 days.  Particularly when she would stand up she would feel lightheaded with a warmth to her face and dimming of the surroundings around her eyes.  She denied any palpitations or chest pain during this time.  Today she was sitting down playing cards with her friends when she began to feel diaphoretic and had a syncopal episode.  She did not have any tonic or clonic movements afterwards.  She immediately regained consciousness and was not confused.  She did not have loss of control of bladder or bowel.  She did not have any chest pain or palpitations.  She denies any fevers, cough, nausea, vomiting, abdominal pain, diarrhea.  She did report one episode of nonbilious nonbloody emesis after the syncopal episode which was noted to be coffee-ground.  She denies any melanotic stools however she has noted that her stool is darker than usual.  ED Course: In the ED patient's vitals were stable.  Labs are notable for hemoglobin of 8.4 with normal hemoglobin proximally 1 year ago.  BMP showed an elevated BUN with a normal creatinine.  GI was consulted and patient was started on IV PPI.  Review of Systems: As per HPI otherwise 10 point review of systems negative.   Past Medical History:  Diagnosis Date  . Hypothyroidism 05/01/2018  . Osteoporosis 05/01/2018  . Peptic ulcer disease 05/01/2018  . Syncope 05/01/2018  . Thyroid disease     Past Surgical History:  Procedure Laterality Date  . HERNIA REPAIR       reports that she has never smoked. She has never used smokeless tobacco. She reports that she does not  drink alcohol or use drugs.  No Known Allergies  Family History  Problem Relation Age of Onset  . Breast cancer Neg Hx      Prior to Admission medications   Medication Sig Start Date End Date Taking? Authorizing Provider  alendronate (FOSAMAX) 70 MG tablet Take 1 tablet by mouth once a week. Tuesdays 02/24/18  Yes [provider]  calcium-vitamin D (OSCAL WITH D) 500-200 MG-UNIT tablet Take 3 tablets by mouth daily with breakfast.   Yes [provider]  Coenzyme Q10 (COQ-10) 10 MG CAPS Take 1 tablet by mouth 2 (two) times daily.   Yes [provider]  cyanocobalamin 1000 MCG tablet Take 1,000 mcg by mouth daily.   Yes [provider]  Garlic 100 MG TABS Take 100 mg by mouth daily.   Yes [provider]  levothyroxine (SYNTHROID, LEVOTHROID) 50 MCG tablet Take 1 tablet by mouth daily. Dose unknown. Pharmacy closed due to holiday.    Yes [provider]  Melatonin 3 MG TABS Take 1 tablet by mouth at bedtime.   Yes [provider]  Multiple Vitamins-Minerals (MULTIVITAMIN WITH MINERALS) tablet Take 2 tablets by mouth daily.   Yes [provider]  Omega-3 Fatty Acids (FISH OIL) 1000 MG CPDR Take 1,000 mg by mouth daily.   Yes [provider]    Physical Exam: Vitals:   05/01/18 1501 05/01/18 1531 05/01/18 1600 05/01/18 1630  BP: 118/63 Marland Kitchen)  109/55 124/67 113/60  Pulse: 77 73 83 78  Resp: SpO2: 100% 100% 100% 100%    Constitutional: NAD, calm, comfortable Vitals:   05/01/18 1501 05/01/18 1531 05/01/18 1600 05/01/18 1630  BP: 118/63 (!) 109/55 124/67 113/60  Pulse: 77 73 83 78  Resp: SpO2: 100% 100% 100% 100%   Eyes: Anicteric sclera ENMT: Dry mucous membranes, good dentition, pale appearing mucosa Neck: normal, supple Respiratory: clear to auscultation bilaterally, no wheezing, no crackles. Normal respiratory effort. No accessory muscle use.  Cardiovascular: Regular rate and  rhythm, 2 out of 6 systolic murmur heard best at left upper sternal border Abdomen: no tenderness, no masses palpated. No hepatosplenomegaly. Bowel sounds positive.  Musculoskeletal: Trace lower extremity edema.  Skin: no rashes on visible skin Neurologic: Grossly intact, moving all extremities Psychiatric: Normal judgment and insight. Alert and oriented x 3. Normal mood.    Labs on Admission: I have personally reviewed following labs and imaging studies  CBC: Recent Labs  Lab 05/01/18 1412  WBC 9.2  HGB 8.4*  HCT 24.1*  MCV 94.1  PLT 209   Basic Metabolic Panel: Recent Labs  Lab 05/01/18 1412  NA 133*  K 3.8  CL 104  CO2 22  GLUCOSE 117*  BUN 33*  CREATININE 0.53  CALCIUM 8.3*   GFR: CrCl cannot be calculated (Unknown ideal weight.). Liver Function Tests: No results for input(s): AST, ALT, ALKPHOS, BILITOT, PROT, ALBUMIN in the last 168 hours. No results for input(s): LIPASE, AMYLASE in the last 168 hours. No results for input(s): AMMONIA in the last 168 hours. Coagulation Profile: No results for input(s): INR, PROTIME in the last 168 hours. Cardiac Enzymes: No results for input(s): CKTOTAL, CKMB, CKMBINDEX, TROPONINI in the last 168 hours. BNP (last 3 results) No results for input(s): PROBNP in the last 8760 hours. HbA1C: No results for input(s): HGBA1C in the last 72 hours. CBG: Recent Labs  Lab 05/01/18 1418  GLUCAP 106*   Lipid Profile: No results for input(s): CHOL, HDL, LDLCALC, TRIG, CHOLHDL, LDLDIRECT in the last 72 hours. Thyroid Function Tests: No results for input(s): TSH, T4TOTAL, FREET4, T3FREE, THYROIDAB in the last 72 hours. Anemia Panel: No results for input(s): VITAMINB12, FOLATE, FERRITIN, TIBC, IRON, RETICCTPCT in the last 72 hours. Urine analysis:    Component Value Date/Time   COLORURINE AMBER (A) 05/01/2018 1611   APPEARANCEUR HAZY (A) 05/01/2018 1611   LABSPEC 1.024 05/01/2018 1611   PHURINE 5.0 05/01/2018 1611   GLUCOSEU  NEGATIVE 05/01/2018 1611   HGBUR NEGATIVE 05/01/2018 1611   BILIRUBINUR NEGATIVE 05/01/2018 1611   KETONESUR 20 (A) 05/01/2018 1611   PROTEINUR NEGATIVE 05/01/2018 1611   NITRITE NEGATIVE 05/01/2018 1611   LEUKOCYTESUR TRACE (A) 05/01/2018 1611    Radiological Exams on Admission: No results found.  EKG: Independently reviewed.  Sinus, no acute ST segment changes  Assessment/Plan Principal Problem:   Syncope Active Problems:   Hypothyroidism   GIB (gastrointestinal bleeding)   Peptic ulcer disease   Osteoporosis   #) Syncope: While she does report no clear exacerbating event patient is significantly anemic, has heme positive stools and does report dark coffee-ground emesis making anemia the most likely cause of her syncope.  At this time will defer on additional syncope work-up or testing and address patient's anemia. -Telemetry  #)  acute anemia: Likely secondary to GI bleeding possibly from prior peptic ulcer disease. -IV famotidine -GI consult -N.p.o. at midnight -Every 6 hours  H&H  #) Hypothyroidism: -Continue levothyroxine 50 mcg daily  #) Osteoporosis: - Hold alendronate 70 mg weekly  Fluids: Gentle IV fluids Elect lites: Monitor and supplement Nutrition: Regular diet  Prophylaxis: SCDs  Disposition: Pending GI evaluation  Full code   Delaine Lame MD [facility]   If 7PM-7AM, please contact night-coverage [email or web address] Password [facility]  05/01/2018, 5:26 PM

## 2018-05-01 NOTE — ED Notes (Signed)
ED Provider at bedside. 

## 2018-05-01 NOTE — ED Provider Notes (Signed)
Pt seen and evaluated. D/W PA. Guaiac +, Anemic. Will need IV H2, transfusion, and GI consult. Stable, but symptomatic UGIB   Rolland Porter, MD 05/01/18 [phone]

## 2018-05-01 NOTE — ED Provider Notes (Signed)
[facility]-EMERGENCY DEPT Provider Note   CSN: [id number] Arrival date & time: 05/01/18  1340     History   Chief Complaint Chief Complaint  Patient presents with  . Loss of Consciousness    HPI Cheryl Schneider is a 82 y.o. female.  Cheryl Schneider is a 82 y.o. Female with history of thyroid disease, and remote history of peptic ulcer disease, presents to the ED for evaluation after syncopal episode.  Patient reports for the past 2 to 3 days she has been feeling a bit "woozy" and lightheaded.  Patient reports she just got back from a recent trip to [place].  Patient reports today while she was at church she was not feeling well and then had a syncopal episode lasting approximately 30 seconds.  Fellow church members gently lowered her to the ground, she did not hit her head or sustain injury injuries.  Patient reports now she feels completely normal.  She denies any chest pain or shortness of breath, no headache or vision changes.  No abdominal pain, patient reports feeling slightly nauseous prior to the episode, but this has not persisted.  No episodes of vomiting.  Patient reports she has had a little bit of diarrhea over the past few days.  Patient reports similar symptoms one time in the past about 11 years ago, where she had a syncopal episode with some nausea, and bloody emesis, was found to have a bleeding gastric ulcer.  Patient is unsure if she has had any dark stools, but denies any bright red blood per rectum.      Past Medical History:  Diagnosis Date  . Thyroid disease     There are no active problems to display for this patient.   Past Surgical History:  Procedure Laterality Date  . HERNIA REPAIR       OB History   None      Home Medications    Prior to Admission medications   Medication Sig Start Date End Date Taking? Authorizing Provider  calcium-vitamin D (OSCAL WITH D) 500-200 MG-UNIT tablet Take 3 tablets by mouth daily with  breakfast.    [provider]  Coenzyme Q10 (COQ-10) 10 MG CAPS Take 1 tablet by mouth 2 (two) times daily.    [provider]  cyanocobalamin 1000 MCG tablet Take 1,000 mcg by mouth daily.    [provider]  Garlic 100 MG TABS Take 100 mg by mouth daily.    [provider]  LEVOTHYROXINE SODIUM PO Take by mouth. Dose unknown. Pharmacy closed due to holiday.    [provider]  Multiple Vitamins-Minerals (MULTIVITAMIN WITH MINERALS) tablet Take 2 tablets by mouth daily.    [provider]  Omega-3 Fatty Acids (FISH OIL) 1000 MG CPDR Take 1,000 mg by mouth daily.    [provider]    Family History Family History  Problem Relation Age of Onset  . Breast cancer Neg Hx     Social History Social History   Tobacco Use  . Smoking status: Never Smoker  . Smokeless tobacco: Never Used  Substance Use Topics  . Alcohol use: No  . Drug use: No     Allergies   Patient has no known allergies.   Review of Systems Review of Systems  Constitutional: Positive for fatigue. Negative for chills and fever.  HENT: Negative for congestion, rhinorrhea and sore throat.   Respiratory: Negative for cough and shortness of breath.   Cardiovascular: Negative for  chest pain.  Gastrointestinal: Positive for diarrhea and nausea. Negative for abdominal pain, blood in stool, constipation and vomiting.  Genitourinary: Negative for dysuria and frequency.  Musculoskeletal: Negative for arthralgias and myalgias.  Skin: Negative for color change and rash.  Neurological: Positive for syncope and light-headedness. Negative for dizziness, weakness and headaches.     Physical Exam Updated Vital Signs BP (!) 109/55   Pulse 73   Resp 14   SpO2 100%   Physical Exam  Constitutional: She is oriented to person, place, and time. She appears well-developed and well-nourished. No distress.  HENT:  Head: Normocephalic and atraumatic.  Mouth/Throat:  Oropharynx is clear and moist.  Eyes: Right eye exhibits no discharge. Left eye exhibits no discharge.  Neck: Neck supple.  Cardiovascular: Normal rate, regular rhythm, normal heart sounds and intact distal pulses.  Pulmonary/Chest: Effort normal and breath sounds normal. No stridor. No respiratory distress. She has no wheezes. She has no rales.  Respirations equal and unlabored, patient able to speak in full sentences, lungs clear to auscultation bilaterally  Abdominal: Soft. Bowel sounds are normal. She exhibits no distension and no mass. There is no tenderness. There is no guarding.  Abdomen soft, nondistended, nontender to palpation in all quadrants without guarding or peritoneal signs  Genitourinary:  Genitourinary Comments: Chaperone present at bedside for rectal exam. Hemoccult grossly positive with dark black soft stools on exam, no bright red blood  Musculoskeletal: She exhibits no edema or deformity.  Neurological: She is alert and oriented to person, place, and time. Coordination normal.  Speech is clear, able to follow commands CN III-XII intact Normal strength in upper and lower extremities bilaterally including dorsiflexion and plantar flexion, strong and equal grip strength Sensation normal to light and sharp touch Moves extremities without ataxia, coordination intact Normal finger to nose and rapid alternating movements No pronator drift  Skin: Skin is warm and dry. Capillary refill takes less than 2 seconds. She is not diaphoretic.  Psychiatric: She has a normal mood and affect. Her behavior is normal.  Nursing note and vitals reviewed.    ED Treatments / Results  Labs (all labs ordered are listed, but only abnormal results are displayed) Labs Reviewed  BASIC METABOLIC PANEL - Abnormal; Notable for the following components:      Result Value   Sodium 133 (*)    Glucose, Bld 117 (*)    BUN 33 (*)    Calcium 8.3 (*)    All other components within normal limits  CBC  - Abnormal; Notable for the following components:   RBC 2.56 (*)    Hemoglobin 8.4 (*)    HCT 24.1 (*)    All other components within normal limits  CBG MONITORING, ED - Abnormal; Notable for the following components:   Glucose-Capillary 106 (*)    All other components within normal limits  POC OCCULT BLOOD, ED - Abnormal; Notable for the following components:   Fecal Occult Bld POSITIVE (*)    All other components within normal limits  URINALYSIS, ROUTINE W REFLEX MICROSCOPIC  TYPE AND SCREEN    EKG None  Radiology No results found.  Procedures Procedures (including critical care time)  Medications Ordered in ED Medications  sodium chloride 0.9 % bolus 500 mL (500 mLs Intravenous New Bag/Given 05/01/18 1507)  famotidine (PEPCID) IVPB 20 mg premix (has no administration in time range)     Initial Impression / Assessment and Plan / ED Course  I have reviewed the triage vital signs  and the nursing notes.  Pertinent labs & imaging results that were available during my care of the patient were reviewed by me and considered in my medical decision making (see chart for details).  Patient presents for evaluation after syncopal episode.  No associated chest pain or shortness of breath.  Remote history of similar symptoms related to bleeding gastric ulcer, the patient has had no recurrence of symptoms since then.  Patient reports mild nausea but denies abdominal pain or vomiting, she has had some mild diarrhea.  Patient's vitals stable here in the ED, she has borderline orthostatic, will give 500 mL fluid bolus.  Labs significant for hemoglobin of 8.4, hemoglobin was 13.61-year ago, no more recent comparison available.  Mild hypo natremia of 133, BUN is elevated at 33, with normal creatinine.  CBG within normal limits.  Hemoccult is grossly positive with dark black stools on exam.  Vitals have remained stable.  Will start patient on twice daily Pepcid.  Will consult GI, patient will need  to be admitted, type and screen sent.  Spoke with Shanda Bumps with GI, as long as patient remains stable we will plan to see in the morning.  Recommend twice daily Pepcid due to Protonix shortage.  Spoke with Dr. Clearnce Sorrel [facility] who will see and admit the patient.  Final Clinical Impressions(s) / ED Diagnoses   Final diagnoses:  Upper GI bleed  Syncope, unspecified syncope type  Anemia, unspecified type    ED Discharge Orders    None       Legrand Rams 05/01/18 1643    Rolland Porter, MD 05/05/18 1219

## 2018-05-02 ENCOUNTER — Encounter (HOSPITAL_COMMUNITY): Payer: Self-pay

## 2018-05-02 ENCOUNTER — Inpatient Hospital Stay (HOSPITAL_COMMUNITY): Payer: Medicare Other | Admitting: Certified Registered Nurse Anesthetist

## 2018-05-02 ENCOUNTER — Encounter (HOSPITAL_COMMUNITY)
Admission: EM | Disposition: A | Discharge status: Home / Self Care | Payer: Self-pay | Source: Home / Self Care | Attending: Internal Medicine

## 2018-05-02 DIAGNOSIS — R55 Syncope and collapse: Secondary | ICD-10-CM

## 2018-05-02 HISTORY — PX: ESOPHAGOGASTRODUODENOSCOPY (EGD) WITH PROPOFOL: SHX5813

## 2018-05-02 LAB — CBC
HCT: 23.6 % — ABNORMAL LOW (ref 36.0–46.0)
Hemoglobin: 8.2 g/dL — ABNORMAL LOW (ref 12.0–15.0)
MCH: 32.2 pg (ref 26.0–34.0)
MCHC: 34.7 g/dL (ref 30.0–36.0)
MCV: 92.5 fL (ref 78.0–100.0)
Platelets: 182 10*3/uL (ref 150–400)
RBC: 2.55 MIL/uL — ABNORMAL LOW (ref 3.87–5.11)
RDW: 14.6 % (ref 11.5–15.5)
WBC: 5.3 10*3/uL (ref 4.0–10.5)

## 2018-05-02 LAB — BASIC METABOLIC PANEL
Chloride: 111 mmol/L (ref 101–111)
Creatinine, Ser: 0.53 mg/dL (ref 0.44–1.00)
GFR calc Af Amer: >60 mL/min (ref >60)
GFR calc non Af Amer: >60 mL/min (ref >60)
Potassium: 3.7 mmol/L (ref 3.5–5.1)
Sodium: 139 mmol/L (ref 135–145)

## 2018-05-02 LAB — BASIC METABOLIC PANEL WITH GFR
Anion gap: 6 (ref 5–15)
BUN: 15 mg/dL (ref 6–20)
CO2: 22 mmol/L (ref 22–32)
Calcium: 8 mg/dL — ABNORMAL LOW (ref 8.9–10.3)
Glucose, Bld: 111 mg/dL — ABNORMAL HIGH (ref 65–99)

## 2018-05-02 LAB — HEMOGLOBIN AND HEMATOCRIT, BLOOD
HCT: 18.6 % — ABNORMAL LOW (ref 36.0–46.0)
HCT: 22 % — ABNORMAL LOW (ref 36.0–46.0)
Hemoglobin: 6.5 g/dL — CL (ref 12.0–15.0)
Hemoglobin: 7.6 g/dL — ABNORMAL LOW (ref 12.0–15.0)

## 2018-05-02 LAB — PREPARE RBC (CROSSMATCH)

## 2018-05-02 SURGERY — ESOPHAGOGASTRODUODENOSCOPY (EGD) WITH PROPOFOL
Anesthesia: Monitor Anesthesia Care

## 2018-05-02 MED ORDER — PROPOFOL 10 MG/ML IV BOLUS
INTRAVENOUS | Status: AC
  Filled 2018-05-02: qty 40

## 2018-05-02 MED ORDER — PROPOFOL 500 MG/50ML IV EMUL
INTRAVENOUS | Status: DC | PRN
  Administered 2018-05-02: 100 ug/kg/min via INTRAVENOUS

## 2018-05-02 MED ORDER — PANTOPRAZOLE SODIUM 40 MG IV SOLR
40.0000 mg | Freq: Two times a day (BID) | INTRAVENOUS | Status: DC
  Administered 2018-05-02 – 2018-05-03 (×3): 40 mg via INTRAVENOUS
  Filled 2018-05-02 (×3): qty 40

## 2018-05-02 MED ORDER — ADULT MULTIVITAMIN W/MINERALS CH
2.0000 | ORAL_TABLET | Freq: Every day | ORAL | Status: DC
  Administered 2018-05-02 – 2018-05-03 (×2): 2 via ORAL
  Filled 2018-05-02 (×2): qty 2

## 2018-05-02 MED ORDER — SODIUM CHLORIDE 0.9 % IV SOLN
Freq: Once | INTRAVENOUS | Status: AC
  Administered 2018-05-02: 02:00:00 via INTRAVENOUS

## 2018-05-02 MED ORDER — PROPOFOL 10 MG/ML IV BOLUS
INTRAVENOUS | Status: DC | PRN
  Administered 2018-05-02 (×3): 30 mg via INTRAVENOUS

## 2018-05-02 MED ORDER — LACTATED RINGERS IV SOLN
INTRAVENOUS | Status: DC
Start: 2018-05-02 — End: 2018-05-02
  Administered 2018-05-02: 1000 mL via INTRAVENOUS

## 2018-05-02 MED ORDER — ZOLPIDEM TARTRATE 5 MG PO TABS
5.0000 mg | ORAL_TABLET | Freq: Every evening | ORAL | Status: DC | PRN
  Administered 2018-05-02: 5 mg via ORAL
  Filled 2018-05-02: qty 1

## 2018-05-02 MED ORDER — FERROUS SULFATE 325 (65 FE) MG PO TABS
325.0000 mg | ORAL_TABLET | Freq: Two times a day (BID) | ORAL | Status: DC
  Administered 2018-05-02 – 2018-05-03 (×2): 325 mg via ORAL
  Filled 2018-05-02 (×2): qty 1

## 2018-05-02 MED ORDER — PROPOFOL 10 MG/ML IV BOLUS
INTRAVENOUS | Status: AC
  Filled 2018-05-02: qty 20

## 2018-05-02 SURGICAL SUPPLY — 15 items

## 2018-05-02 NOTE — Anesthesia Postprocedure Evaluation (Signed)
Anesthesia Post Note  Patient: Cheryl Schneider  Procedure(s) Performed: ESOPHAGOGASTRODUODENOSCOPY (EGD) WITH PROPOFOL (N/A )     Patient location during evaluation: PACU Anesthesia Type: MAC Level of consciousness: awake and alert Pain management: pain level controlled Vital Signs Assessment: post-procedure vital signs reviewed and stable Respiratory status: spontaneous breathing Cardiovascular status: stable Anesthetic complications: no    Last Vitals:  Vitals:   05/02/18 1335 05/02/18 1340  BP:  (!) 105/52  Pulse: 74 75  Resp: (!) 9 14  Temp:    SpO2: 97% 99%    Last Pain:  Vitals:   05/02/18 1335  TempSrc:   PainSc: 0-No pain                 Nolon Nations

## 2018-05-02 NOTE — Anesthesia Preprocedure Evaluation (Addendum)
Anesthesia Evaluation  Patient identified by MRN, date of birth, ID band Patient awake    Reviewed: Allergy & Precautions, NPO status , Patient's Chart, lab work & pertinent test results  Airway Mallampati: II  TM Distance: >3 FB Neck ROM: Full    Dental  (+) Caps   Pulmonary neg pulmonary ROS,    Pulmonary exam normal breath sounds clear to auscultation       Cardiovascular negative cardio ROS Normal cardiovascular exam Rhythm:Regular Rate:Normal     Neuro/Psych negative neurological ROS  negative psych ROS   GI/Hepatic negative GI ROS, Neg liver ROS, PUD,   Endo/Other  negative endocrine ROSHypothyroidism   Renal/GU negative Renal ROS     Musculoskeletal negative musculoskeletal ROS (+)   Abdominal   Peds  Hematology negative hematology ROS (+)   Anesthesia Other Findings   Reproductive/Obstetrics                            Anesthesia Physical Anesthesia Plan  ASA: II  Anesthesia Plan: MAC   Post-op Pain Management:    Induction: Intravenous  PONV Risk Score and Plan: 2 and Ondansetron and Propofol infusion  Airway Management Planned:   Additional Equipment:   Intra-op Plan:   Post-operative Plan:   Informed Consent: I have reviewed the patients History and Physical, chart, labs and discussed the procedure including the risks, benefits and alternatives for the proposed anesthesia with the patient or authorized representative who has indicated his/her understanding and acceptance.   Dental advisory given  Plan Discussed with: CRNA  Anesthesia Plan Comments:         Anesthesia Quick Evaluation

## 2018-05-02 NOTE — Interval H&P Note (Signed)
History and Physical Interval Note:  05/02/2018 1:05 PM  Cheryl Schneider  has presented today for surgery, with the diagnosis of ugi bleed  The various methods of treatment have been discussed with the patient and family. After consideration of risks, benefits and other options for treatment, the patient has consented to  Procedure(s): ESOPHAGOGASTRODUODENOSCOPY (EGD) WITH PROPOFOL (N/A) as a surgical intervention .  The patient's history has been reviewed, patient examined, no change in status, stable for surgery.  I have reviewed the patient's chart and labs.  Questions were answered to the patient's satisfaction.     Cheryl Schneider C.

## 2018-05-02 NOTE — Brief Op Note (Signed)
Nonbleeding gastric antral ulcer. No blood products in stomach. Continue Protonix 40 mg IV Q 12 hours. Clear liquids ok. If stable then slowly advance diet tomorrow. H. Pylori serology ordered. [facility] will f/u tomorrow.

## 2018-05-02 NOTE — Progress Notes (Signed)
Paged doctor to notify that lab called with a critical Hemoglobin level.

## 2018-05-02 NOTE — Transfer of Care (Signed)
Immediate Anesthesia Transfer of Care Note  Patient: Cheryl Schneider  Procedure(s) Performed: ESOPHAGOGASTRODUODENOSCOPY (EGD) WITH PROPOFOL (N/A )  Patient Location: PACU and Endoscopy Unit  Anesthesia Type:MAC  Level of Consciousness: awake, alert , oriented and patient cooperative  Airway & Oxygen Therapy: Patient Spontanous Breathing and Patient connected to nasal cannula oxygen  Post-op Assessment: Report given to RN and Post -op Vital signs reviewed and stable  Post vital signs: Reviewed and stable  Last Vitals:  Vitals Value Taken Time  BP 109/42 05/02/2018  1:26 PM  Temp    Pulse 79 05/02/2018  1:28 PM  Resp 18 05/02/2018  1:28 PM  SpO2 97 % 05/02/2018  1:28 PM  Vitals shown include unvalidated device data.  Last Pain:  Vitals:   05/02/18 1256  TempSrc: Oral  PainSc: 0-No pain      Patients Stated Pain Goal: 0 (03/35/33 1740)  Complications: No apparent anesthesia complications

## 2018-05-02 NOTE — Progress Notes (Signed)
PROGRESS NOTE    Cheryl Schneider  ZOX:[id number] DOB: 05-09-1936 DOA: 05/01/2018 PCP: Fatima Sanger, FNP   Brief Narrative: Patient is a 82 year old female with past medical history of hypothyroidism, peptic ulcer disease, osteoporosis who presented to the emergency department with syncopal episode.  Patient reported that she had nonbilious, nonbloody emesis but was coffee-ground in color.  Also noted that she was having dark stools at home.  On presentation to the emergency department, her hemoglobin was found to be 8.4 but it was normal about a year ago.  Edema to be positive.  GI bleed suspected.  Patient was started on IV PPI and GI was consulted.  She underwent  EGD today.  Assessment & Plan:   Principal Problem:   Syncope Active Problems:   Hypothyroidism   GIB (gastrointestinal bleeding)   Peptic ulcer disease   Osteoporosis  Syncope: Most likely secondary to anemia.  Currently she does not complain of any dizziness or lightheadedness.  She is hemodynamically stable.  Continue gentle IV fluids.    Acute blood loss anemia: Secondary to GI bleed.  She has history of peptic ulcer disease in the past.  Continue PPI 40 mg IV twice daily.  Underwent EGD today.  Found to have nonbleeding gastric antral ulcer, gastritis.  Started on clear liquid diet today.  Plan is to advance her diet tomorrow. Her hemoglobin dropped to 6.5 .She has been transfused with 1 unit of PRBC today and her hemoglobin is 8.2 today .  Hypothyroidism: Continue levothyroxine.  Osteoporosis: On alendronate 70 mg weekly.   DVT prophylaxis: SCD Code Status: Full Family Communication: None present at the bedside Disposition Plan: Home tomorrow   Consultants: GI  Procedures:EGD  Antimicrobials: None  Subjective: Patient seen and examined at bedside this morning.  Remains comfortable.  Denies any abdominal pain.  Denies any black tarry stools or emesis.  Undergoing EGD today.  Objective: Vitals:   05/02/18 1328 05/02/18 1330 05/02/18 1335 05/02/18 1340  BP: (!) 109/42 (!) 107/44  (!) 105/52  Pulse: 74 75 74 75  Resp: 15 14 (!) 9 14  Temp: 97.9 F (36.6 C)     TempSrc: Oral     SpO2: 98% 94% 97% 99%  Weight:      Height:        Intake/Output Summary (Last 24 hours) at 05/02/2018 1420 Last data filed at 05/02/2018 1325 Gross per 24 hour  Intake 1485 ml  Output 2550 ml  Net -1065 ml   Filed Weights   05/01/18 1904 05/02/18 1256  Weight: 77.1 kg (170 lb) 77.1 kg (170 lb)    Examination:  General exam: Appears calm and comfortable ,Not in distress,average built HEENT:PERRL,Oral mucosa moist, Ear/Nose normal on gross exam Respiratory system: Bilateral equal air entry, normal vesicular breath sounds, no wheezes or crackles  Cardiovascular system: S1 & S2 heard, RRR. No JVD, murmurs, rubs, gallops or clicks. No pedal edema. Gastrointestinal system: Abdomen is nondistended, soft and nontender. No organomegaly or masses felt. Normal bowel sounds heard. Central nervous system: Alert and oriented. No focal neurological deficits. Extremities: No edema, no clubbing ,no cyanosis, distal peripheral pulses palpable. Skin: No rashes, lesions or ulcers,no icterus ,no pallor MSK: Normal muscle bulk,tone ,power Psychiatry: Judgement and insight appear normal. Mood & affect appropriate.     Data Reviewed: I have personally reviewed following labs and imaging studies  CBC: Recent Labs  Lab 05/01/18 1412 05/01/18 1933 05/02/18 0033 05/02/18 0718  WBC 9.2  --   --  5.3  HGB 8.4* 7.1* 6.5* 8.2*  HCT 24.1* 20.2* 18.6* 23.6*  MCV 94.1  --   --  92.5  PLT 209  --   --  182   Basic Metabolic Panel: Recent Labs  Lab 05/01/18 1412 05/02/18 0718  NA 133* 139  K 3.8 3.7  CL 104 111  CO2 22 22  GLUCOSE 117* 111*  BUN 33* 15  CREATININE 0.53 0.53  CALCIUM 8.3* 8.0*   GFR: Estimated Creatinine Clearance: 58.5 mL/min (by C-G formula based on SCr of 0.53 mg/dL). Liver Function  Tests: No results for input(s): AST, ALT, ALKPHOS, BILITOT, PROT, ALBUMIN in the last 168 hours. No results for input(s): LIPASE, AMYLASE in the last 168 hours. No results for input(s): AMMONIA in the last 168 hours. Coagulation Profile: No results for input(s): INR, PROTIME in the last 168 hours. Cardiac Enzymes: No results for input(s): CKTOTAL, CKMB, CKMBINDEX, TROPONINI in the last 168 hours. BNP (last 3 results) No results for input(s): PROBNP in the last 8760 hours. HbA1C: No results for input(s): HGBA1C in the last 72 hours. CBG: Recent Labs  Lab 05/01/18 1418  GLUCAP 106*   Lipid Profile: No results for input(s): CHOL, HDL, LDLCALC, TRIG, CHOLHDL, LDLDIRECT in the last 72 hours. Thyroid Function Tests: No results for input(s): TSH, T4TOTAL, FREET4, T3FREE, THYROIDAB in the last 72 hours. Anemia Panel: No results for input(s): VITAMINB12, FOLATE, FERRITIN, TIBC, IRON, RETICCTPCT in the last 72 hours. Sepsis Labs: No results for input(s): PROCALCITON, LATICACIDVEN in the last 168 hours.  No results found for this or any previous visit (from the past 240 hour(s)).       Radiology Studies: No results found.      Scheduled Meds: . calcium-vitamin D  3 tablet Oral Q breakfast  . levothyroxine  50 mcg Oral QAC breakfast  . Melatonin  1 tablet Oral QHS  . multivitamin with minerals  2 tablet Oral Daily  . omega-3 acid ethyl esters  1 g Oral BID  . pantoprazole (PROTONIX) IV  40 mg Intravenous Q12H  . cyanocobalamin  1,000 mcg Oral Daily   Continuous Infusions: . sodium chloride 100 mL/hr at 05/02/18 1100     LOS: 1 day    Time spent: 35 mins.More than 50% of that time was spent in counseling and/or coordination of care.      Burnadette Pop, MD [facility] Pager [phone]  If 7PM-7AM, please contact night-coverage [email or web address] Password [email or web address] 05/02/2018, 2:20 PM

## 2018-05-02 NOTE — Op Note (Signed)
[facility] Patient Name: Cheryl Schneider Procedure Date: 05/02/2018 MRN: [id number] Attending MD: Shirley Friar , MD Date of Birth: [birth year] CSN: [id number] Age: 82 Admit Type: Inpatient Procedure:                Upper GI endoscopy Indications:              Suspected upper gastrointestinal bleeding,                            Coffee-ground emesis Providers:                Shirley Friar, MD, Janae Sauce. Steele Berg, RN,                            Madalyn Rob, Technician Referring MD:              Medicines:                Propofol per Anesthesia, Monitored Anesthesia Care Complications:            No immediate complications. Estimated Blood Loss:     Estimated blood loss: none. Procedure:                Pre-Anesthesia Assessment:                           - Prior to the procedure, a History and Physical                            was performed, and patient medications and                            allergies were reviewed. The patient's tolerance of                            previous anesthesia was also reviewed. The risks                            and benefits of the procedure and the sedation                            options and risks were discussed with the patient.                            All questions were answered, and informed consent                            was obtained. Prior Anticoagulants: The patient has                            taken no previous anticoagulant or antiplatelet                            agents. ASA Grade Assessment: II - A patient with  mild systemic disease. After reviewing the risks                            and benefits, the patient was deemed in                            satisfactory condition to undergo the procedure.                           After obtaining informed consent, the endoscope was                            passed under direct vision. Throughout the   procedure, the patient's blood pressure, pulse, and                            oxygen saturations were monitored continuously. The                            [id number]-[id number] ([id number]) scope was introduced through the                            mouth, and advanced to the second part of duodenum.                            The upper GI endoscopy was accomplished without                            difficulty. The patient tolerated the procedure                            well. Scope In: Scope Out: Findings:      The examined esophagus was normal.      The Z-line was regular and was found 40 cm from the incisors.      One non-bleeding superficial gastric ulcer with no stigmata of bleeding       was found in the gastric antrum. Slight pinkish-red area in ulcer but no       visible vessel appreciated. The lesion was 7 mm in largest dimension.      Segmental moderate inflammation characterized by congestion (edema) and       erythema was found in the gastric antrum.      The cardia and gastric fundus were normal on retroflexion.      The examined duodenum was normal.      There is no endoscopic evidence of bleeding or blood products in the       entire examined stomach. Impression:               - Normal esophagus.                           - Z-line regular, 40 cm from the incisors.                           - Non-bleeding gastric ulcer with no stigmata of  bleeding.                           - Acute gastritis.                           - Normal examined duodenum.                           - No specimens collected. Moderate Sedation:      N/A- Per Anesthesia Care Recommendation:           - Clear liquid diet.                           - Observe patient's clinical course.                           - Use Protonix (pantoprazole) 40 mg IV BID.                           - Perform an H. pylori serology. Procedure Code(s):        --- Professional ---                            [phone], Esophagogastroduodenoscopy, flexible,                            transoral; diagnostic, including collection of                            specimen(s) by brushing or washing, when performed                            (separate procedure) Diagnosis Code(s):        --- Professional ---                           K92.0, Hematemesis                           K25.9, Gastric ulcer, unspecified as acute or                            chronic, without hemorrhage or perforation                           K29.00, Acute gastritis without bleeding CPT copyright 2017 [employer]. All rights reserved. The codes documented in this report are preliminary and upon coder review may  be revised to meet current compliance requirements. Shirley Friar, MD 05/02/2018 1:33:02 PM This report has been signed electronically. Number of Addenda: 0

## 2018-05-03 ENCOUNTER — Encounter (HOSPITAL_COMMUNITY): Payer: Self-pay | Admitting: Gastroenterology

## 2018-05-03 DIAGNOSIS — K2971 Gastritis, unspecified, with bleeding: Secondary | ICD-10-CM

## 2018-05-03 DIAGNOSIS — K279 Peptic ulcer, site unspecified, unspecified as acute or chronic, without hemorrhage or perforation: Secondary | ICD-10-CM

## 2018-05-03 LAB — BPAM RBC
Blood Product Expiration Date: 201906062359
ISSUE DATE / TIME: 201905220214
UNIT TYPE AND RH: 6200

## 2018-05-03 LAB — CBC WITH DIFFERENTIAL/PLATELET
BASOS ABS: 0 10*3/uL (ref 0.0–0.1)
BASOS PCT: 0 %
EOS ABS: 0.2 10*3/uL (ref 0.0–0.7)
Eosinophils Relative: 4 %
HCT: 22.6 % — ABNORMAL LOW (ref 36.0–46.0)
Hemoglobin: 7.6 g/dL — ABNORMAL LOW (ref 12.0–15.0)
Lymphocytes Relative: 33 %
Lymphs Abs: 1.5 10*3/uL (ref 0.7–4.0)
MCH: 32.1 pg (ref 26.0–34.0)
MCHC: 33.6 g/dL (ref 30.0–36.0)
MCV: 95.4 fL (ref 78.0–100.0)
MONOS PCT: 9 %
Monocytes Absolute: 0.4 10*3/uL (ref 0.1–1.0)
NEUTROS ABS: 2.6 10*3/uL (ref 1.7–7.7)
NEUTROS PCT: 54 %
Platelets: 180 10*3/uL (ref 150–400)
RBC: 2.37 MIL/uL — ABNORMAL LOW (ref 3.87–5.11)
RDW: 15.3 % (ref 11.5–15.5)
WBC: 4.7 10*3/uL (ref 4.0–10.5)

## 2018-05-03 LAB — TYPE AND SCREEN
ABO/RH(D): A POS
Antibody Screen: NEGATIVE
Unit division: 0

## 2018-05-03 LAB — H. PYLORI ANTIBODY, IGG

## 2018-05-03 MED ORDER — PANTOPRAZOLE SODIUM 40 MG PO TBEC: 40.0000 mg | DELAYED_RELEASE_TABLET | Freq: Two times a day (BID) | ORAL | 0 refills | Status: AC

## 2018-05-03 MED ORDER — PANTOPRAZOLE SODIUM 40 MG PO TBEC: 40.0000 mg | DELAYED_RELEASE_TABLET | Freq: Two times a day (BID) | ORAL | 0 refills | Status: DC

## 2018-05-03 MED ORDER — FERROUS SULFATE 325 (65 FE) MG PO TABS: 325.0000 mg | ORAL_TABLET | Freq: Two times a day (BID) | ORAL | 0 refills | Status: DC

## 2018-05-03 NOTE — Discharge Summary (Signed)
Physician Discharge Summary  Cheryl Schneider ZOX:[id number] DOB: 04-08-1936 DOA: 05/01/2018  PCP: Fatima Sanger, FNP  Admit date: 05/01/2018 Discharge date: 05/03/2018  Admitted From: Home Disposition:  Home  Discharge Condition:Stable CODE STATUS:FULL Diet recommendation:  Regular  Brief/Interim Summary: Patient is a 82 year old female with past medical history of hypothyroidism, peptic ulcer disease, osteoporosis who presented to the emergency department with syncopal episode.  Patient reported that she had nonbilious, nonbloody emesis but was coffee-ground in color.  Also noted that she was having dark stools at home.  On presentation to the emergency department, her hemoglobin was found to be 8.4 but it was normal about a year ago.  FOBT was found  to be positive.  GI bleed suspected. Patient was started on IV PPI and GI was consulted.  She underwent  EGD with finding of nonbleeding gastric antral ulcer and gastritis. Currently her  H&H is stable.  She has been started on iron supplements and PPI. We advance her diet today and she tolerated it well .She is stable for discharge to home today.  She will follow-up with her PCP and gastroenterology as an outpatient.  Following problems were addressed during her hospitalization:  Syncope: Most likely secondary to anemia.  Currently she does not complain of any dizziness or lightheadedness.  She is hemodynamically stable.      Acute blood loss anemia: Secondary to GI bleed.  She has history of peptic ulcer disease in the past.  Continue PPI 40 mg PO twice daily. Underwent EGD here on this admission.  Found to have nonbleeding gastric antral ulcer, gastritis.  Diet advanced which she tolerated. Her hemoglobin had dropped to 6.5 .She has been transfused with 1 unit of PRBC today and her hemoglobin is 7.6 today . She has been started on iron supplements. She needs to follow-up with your PCP in a week and check her hemoglobin. Biopsy for H.  pylori has also been taken during endoscopic procedure which will be followed by GI.  Hypothyroidism: Continue levothyroxine.  Osteoporosis: On alendronate 70 mg weekly    Discharge Diagnoses:  Principal Problem:   Syncope Active Problems:   Hypothyroidism   GIB (gastrointestinal bleeding)   Peptic ulcer disease   Osteoporosis    Discharge Instructions  Discharge Instructions    Diet general   Complete by:  As directed    Discharge instructions   Complete by:  As directed    1) Follow up with your PCP in a week.  Do a CBC test during the follow-up to check your hemoglobin. 2) Take prescribed medications as instructed. 3) Follow up with gastroenterology as an outpatient.  Name and number of the provider has been attached.   Increase activity slowly   Complete by:  As directed      Allergies as of 05/03/2018   No Known Allergies     Medication List    TAKE these medications   alendronate 70 MG tablet Commonly known as:  FOSAMAX Take 1 tablet by mouth once a week. Tuesdays   calcium-vitamin D 500-200 MG-UNIT tablet Commonly known as:  OSCAL WITH D Take 3 tablets by mouth daily with breakfast.   CoQ-10 10 MG Caps Take 1 tablet by mouth 2 (two) times daily.   cyanocobalamin 1000 MCG tablet Take 1,000 mcg by mouth daily.   ferrous sulfate 325 (65 FE) MG tablet Take 1 tablet (325 mg total) by mouth 2 (two) times daily with a meal.   Fish Oil 1000 MG  Cpdr Take 1,000 mg by mouth daily.   Garlic 100 MG Tabs Take 100 mg by mouth daily.   levothyroxine 50 MCG tablet Commonly known as:  SYNTHROID, LEVOTHROID Take 1 tablet by mouth daily. Dose unknown. Pharmacy closed due to holiday.   Melatonin 3 MG Tabs Take 1 tablet by mouth at bedtime.   multivitamin with minerals tablet Take 2 tablets by mouth daily.   pantoprazole 40 MG tablet Commonly known as:  PROTONIX Take 1 tablet (40 mg total) by mouth 2 (two) times daily.      Follow-up Information     Fatima Sanger, FNP. Schedule an appointment as soon as possible for a visit in 1 week(s).   Specialty:  Internal Medicine Contact information: [address] [place] [address] [place] [address] [phone]        Charlott Rakes, MD. Schedule an appointment as soon as possible for a visit in 2 week(s).   Specialty:  Gastroenterology Contact information: [address] [place] [address] [phone]          No Known Allergies  Consultations: GI  Procedures/Studies:  No results found.    Subjective: Patient seen and examined the bedside this morning.  Looks very comfortable.  No new issues/complaints.  Stable for discharge home today.  Discharge Exam: Vitals:   05/02/18 2034 05/03/18 0448  BP: (!) 114/50 (!) 114/59  Pulse:  71  Resp:  18  Temp:  98.2 F (36.8 C)  SpO2:  99%   Vitals:   05/02/18 1340 05/02/18 2031 05/02/18 2034 05/03/18 0448  BP: (!) 105/52 (!) 107/37 (!) 114/50 (!) 114/59  Pulse: 75 68  71  Resp: Temp:  99.6 F (37.6 C)  98.2 F (36.8 C)  TempSrc:  Oral  Oral  SpO2: 99% 99%  99%  Weight:      Height:        General: Pt is alert, awake, not in acute distress Cardiovascular: RRR, S1/S2 +, no rubs, no gallops Respiratory: CTA bilaterally, no wheezing, no rhonchi Abdominal: Soft, NT, ND, bowel sounds + Extremities: no edema, no cyanosis    The results of significant diagnostics from this hospitalization (including imaging, microbiology, ancillary and laboratory) are listed below for reference.     Microbiology: No results found for this or any previous visit (from the past 240 hour(s)).   Labs: BNP (last 3 results) No results for input(s): BNP in the last 8760 hours. Basic Metabolic Panel: Recent Labs  Lab 05/01/18 1412 05/02/18 0718  NA 133* 139  K 3.8 3.7  CL 104 111  CO2 22 22  GLUCOSE 117* 111*  BUN 33* 15  CREATININE 0.53 0.53  CALCIUM 8.3* 8.0*   Liver Function Tests: No  results for input(s): AST, ALT, ALKPHOS, BILITOT, PROT, ALBUMIN in the last 168 hours. No results for input(s): LIPASE, AMYLASE in the last 168 hours. No results for input(s): AMMONIA in the last 168 hours. CBC: Recent Labs  Lab 05/01/18 1412 05/01/18 1933 05/02/18 0033 05/02/18 0718 05/02/18 1431 05/03/18 0439  WBC 9.2  --   --  5.3  --  4.7  NEUTROABS  --   --   --   --   --  2.6  HGB 8.4* 7.1* 6.5* 8.2* 7.6* 7.6*  HCT 24.1* 20.2* 18.6* 23.6* 22.0* 22.6*  MCV 94.1  --   --  92.5  --  95.4  PLT 209  --   --  182  --  180   Cardiac Enzymes: No results for input(s): CKTOTAL, CKMB, CKMBINDEX, TROPONINI in the last 168 hours. BNP: Invalid input(s): POCBNP CBG: Recent Labs  Lab 05/01/18 1418  GLUCAP 106*   D-Dimer No results for input(s): DDIMER in the last 72 hours. Hgb A1c No results for input(s): HGBA1C in the last 72 hours. Lipid Profile No results for input(s): CHOL, HDL, LDLCALC, TRIG, CHOLHDL, LDLDIRECT in the last 72 hours. Thyroid function studies No results for input(s): TSH, T4TOTAL, T3FREE, THYROIDAB in the last 72 hours.  Invalid input(s): FREET3 Anemia work up No results for input(s): VITAMINB12, FOLATE, FERRITIN, TIBC, IRON, RETICCTPCT in the last 72 hours. Urinalysis    Component Value Date/Time   COLORURINE AMBER (A) 05/01/2018 1611   APPEARANCEUR HAZY (A) 05/01/2018 1611   LABSPEC 1.024 05/01/2018 1611   PHURINE 5.0 05/01/2018 1611   GLUCOSEU NEGATIVE 05/01/2018 1611   HGBUR NEGATIVE 05/01/2018 1611   BILIRUBINUR NEGATIVE 05/01/2018 1611   KETONESUR 20 (A) 05/01/2018 1611   PROTEINUR NEGATIVE 05/01/2018 1611   NITRITE NEGATIVE 05/01/2018 1611   LEUKOCYTESUR TRACE (A) 05/01/2018 1611   Sepsis Labs Invalid input(s): PROCALCITONIN,  WBC,  LACTICIDVEN Microbiology No results found for this or any previous visit (from the past 240 hour(s)).  Please note: You were cared for by a hospitalist during your hospital stay. Once you are discharged, your  primary care physician will handle any further medical issues. Please note that NO REFILLS for any discharge medications will be authorized once you are discharged, as it is imperative that you return to your primary care physician (or establish a relationship with a primary care physician if you do not have one) for your post hospital discharge needs so that they can reassess your need for medications and monitor your lab values.    Time coordinating discharge: 40 minutes  SIGNED:   Burnadette Pop, MD  [facility] 05/03/2018, 12:42 PM Pager [phone]  If 7PM-7AM, please contact night-coverage [email or web address] Password [email or web address]

## 2018-05-03 NOTE — Progress Notes (Addendum)
Cheryl Schneider 11:11 AM  Subjective: Patient doing fine without any problems from her endoscopy and we reviewed those resultsand her case discussed with my partner Dr. B who will follow her up next week and other than black stools she had no other GI complaints but does have a history of ulcers years ago but has not had a recent colonoscopy which we discussed based on her activity level in good health otherwise I will would probably recommend 1 more and she denies being on any aspirin or nonsteroidals  Objective: Vital signs stable afebrile no acute distress abdomen is soft nontender hemoglobin 7.6 stable from yesterday no chemistries today but Dr. Bosie Clos ordered H. Pylori antibody which is pending  Assessment: Gastric ulcer  Plan: Patient will go home on iron and follow-up with Dr. B week for repeat CBC and to check H. Pylori antibody which is pending as above and the decision about repeat endoscopy to document healing and she'll need to go home on pump inhibitors and would consider repeat colonoscopy just to be sure  [facility]  Pager [phone] After 5PM or if no answer call [phone]

## 2018-06-22 ENCOUNTER — Other Ambulatory Visit: Payer: Self-pay | Admitting: Gastroenterology

## 2018-07-06 ENCOUNTER — Other Ambulatory Visit: Payer: Self-pay | Admitting: Registered Nurse

## 2018-07-06 DIAGNOSIS — Z1231 Encounter for screening mammogram for malignant neoplasm of breast: Secondary | ICD-10-CM

## 2018-08-08 ENCOUNTER — Ambulatory Visit: Payer: Medicare Other

## 2018-08-15 ENCOUNTER — Ambulatory Visit
Admission: RE | Admit: 2018-08-15 | Discharge: 2018-08-15 | Disposition: A | Discharge status: Home / Self Care | Payer: Medicare Other | Source: Ambulatory Visit | Attending: Registered Nurse | Admitting: Registered Nurse

## 2018-08-15 DIAGNOSIS — Z1231 Encounter for screening mammogram for malignant neoplasm of breast: Secondary | ICD-10-CM

## 2018-08-30 ENCOUNTER — Other Ambulatory Visit: Payer: Self-pay | Admitting: Gastroenterology

## 2018-09-03 ENCOUNTER — Encounter (HOSPITAL_COMMUNITY): Payer: Self-pay | Admitting: *Deleted

## 2018-09-03 ENCOUNTER — Other Ambulatory Visit: Payer: Self-pay

## 2018-09-04 ENCOUNTER — Ambulatory Visit (HOSPITAL_COMMUNITY): Payer: Medicare Other | Admitting: Anesthesiology

## 2018-09-04 ENCOUNTER — Encounter (HOSPITAL_COMMUNITY): Payer: Self-pay | Admitting: *Deleted

## 2018-09-04 ENCOUNTER — Ambulatory Visit (HOSPITAL_COMMUNITY)
Admission: RE | Admit: 2018-09-04 | Discharge: 2018-09-04 | Disposition: A | Discharge status: Home / Self Care | Payer: Medicare Other | Source: Ambulatory Visit | Attending: Gastroenterology | Admitting: Gastroenterology

## 2018-09-04 ENCOUNTER — Encounter (HOSPITAL_COMMUNITY)
Admission: RE | Disposition: A | Discharge status: Home / Self Care | Payer: Self-pay | Source: Ambulatory Visit | Attending: Gastroenterology

## 2018-09-04 DIAGNOSIS — K449 Diaphragmatic hernia without obstruction or gangrene: Secondary | ICD-10-CM | POA: Diagnosis not present

## 2018-09-04 DIAGNOSIS — Z8711 Personal history of peptic ulcer disease: Secondary | ICD-10-CM | POA: Insufficient documentation

## 2018-09-04 DIAGNOSIS — Z79890 Hormone replacement therapy (postmenopausal): Secondary | ICD-10-CM | POA: Diagnosis not present

## 2018-09-04 DIAGNOSIS — Z79899 Other long term (current) drug therapy: Secondary | ICD-10-CM | POA: Diagnosis not present

## 2018-09-04 DIAGNOSIS — K2900 Acute gastritis without bleeding: Secondary | ICD-10-CM | POA: Insufficient documentation

## 2018-09-04 DIAGNOSIS — K250 Acute gastric ulcer with hemorrhage: Secondary | ICD-10-CM | POA: Diagnosis present

## 2018-09-04 DIAGNOSIS — E039 Hypothyroidism, unspecified: Secondary | ICD-10-CM | POA: Diagnosis not present

## 2018-09-04 HISTORY — PX: ESOPHAGOGASTRODUODENOSCOPY (EGD) WITH PROPOFOL: SHX5813

## 2018-09-04 LAB — POCT I-STAT 4, (NA,K, GLUC, HGB,HCT)
Glucose, Bld: 103 mg/dL — ABNORMAL HIGH (ref 70–99)
HCT: 40 % (ref 36.0–46.0)
HEMOGLOBIN: 13.6 g/dL (ref 12.0–15.0)
POTASSIUM: 4.1 mmol/L (ref 3.5–5.1)
SODIUM: 139 mmol/L (ref 135–145)

## 2018-09-04 SURGERY — ESOPHAGOGASTRODUODENOSCOPY (EGD) WITH PROPOFOL
Anesthesia: Monitor Anesthesia Care

## 2018-09-04 MED ORDER — LACTATED RINGERS IV SOLN
INTRAVENOUS | Status: DC
  Administered 2018-09-04: 1000 mL via INTRAVENOUS

## 2018-09-04 MED ORDER — SODIUM CHLORIDE 0.9 % IV SOLN: INTRAVENOUS | Status: DC

## 2018-09-04 MED ORDER — PROPOFOL 500 MG/50ML IV EMUL
INTRAVENOUS | Status: DC | PRN
  Administered 2018-09-04: 75 ug/kg/min via INTRAVENOUS

## 2018-09-04 MED ORDER — PROPOFOL 10 MG/ML IV BOLUS
INTRAVENOUS | Status: DC | PRN
  Administered 2018-09-04: 20 mg via INTRAVENOUS
  Administered 2018-09-04: 10 mg via INTRAVENOUS
  Administered 2018-09-04: 20 mg via INTRAVENOUS

## 2018-09-04 MED ORDER — PROPOFOL 10 MG/ML IV BOLUS
INTRAVENOUS | Status: AC
  Filled 2018-09-04: qty 40

## 2018-09-04 SURGICAL SUPPLY — 15 items

## 2018-09-04 NOTE — Transfer of Care (Signed)
Immediate Anesthesia Transfer of Care Note  Patient: Cheryl Schneider  Procedure(s) Performed: ESOPHAGOGASTRODUODENOSCOPY (EGD) WITH PROPOFOL (N/A )  Patient Location: PACU  Anesthesia Type:MAC  Level of Consciousness: sedated  Airway & Oxygen Therapy: Patient Spontanous Breathing and Patient connected to nasal cannula oxygen  Post-op Assessment: Report given to RN and Post -op Vital signs reviewed and stable  Post vital signs: Reviewed and stable  Last Vitals:  Vitals Value Taken Time  BP    Temp    Pulse    Resp    SpO2      Last Pain:  Vitals:   09/04/18 0735  TempSrc: Oral  PainSc: 0-No pain         Complications: No apparent anesthesia complications

## 2018-09-04 NOTE — Anesthesia Postprocedure Evaluation (Signed)
Anesthesia Post Note  Patient: Cheryl Schneider  Procedure(s) Performed: ESOPHAGOGASTRODUODENOSCOPY (EGD) WITH PROPOFOL (N/A )     Patient location during evaluation: Endoscopy Anesthesia Type: MAC Level of consciousness: awake Pain management: pain level controlled Vital Signs Assessment: post-procedure vital signs reviewed and stable Respiratory status: spontaneous breathing Cardiovascular status: stable Postop Assessment: no apparent nausea or vomiting Anesthetic complications: no    Last Vitals:  Vitals:   09/04/18 0915 09/04/18 0950  BP: 111/65 (!) 146/83  Pulse: 62 69  Resp: (!) 23 20  Temp: 36.4 C 36.5 C  SpO2: 98% 96%    Last Pain:  Vitals:   09/04/18 0910  TempSrc:   PainSc: 0-No pain                 Ami Thornsberry

## 2018-09-04 NOTE — H&P (Signed)
Date of Initial H&P: 08/30/18  History reviewed, patient examined, no change in status, stable for surgery.  

## 2018-09-04 NOTE — Discharge Instructions (Signed)
Esophagogastroduodenoscopy, Care After °Refer to this sheet in the next few weeks. These instructions provide you with information about caring for yourself after your procedure. Your health care provider may also give you more specific instructions. Your treatment has been planned according to current medical practices, but problems sometimes occur. Call your health care provider if you have any problems or questions after your procedure. °What can I expect after the procedure? °After the procedure, it is common to have: °· A sore throat. °· Nausea. °· Bloating. °· Dizziness. °· Fatigue. ° °Follow these instructions at home: °· Do not eat or drink anything until the numbing medicine (local anesthetic) has worn off and your gag reflex has returned. You will know that the local anesthetic has worn off when you can swallow comfortably. °· Do not drive for 24 hours if you received a medicine to help you relax (sedative). °· If your health care provider took a tissue sample for testing during the procedure, make sure to get your test results. This is your responsibility. Ask your health care provider or the department performing the test when your results will be ready. °· Keep all follow-up visits as told by your health care provider. This is important. °Contact a health care provider if: °· You cannot stop coughing. °· You are not urinating. °· You are urinating less than usual. °Get help right away if: °· You have trouble swallowing. °· You cannot eat or drink. °· You have throat or chest pain that gets worse. °· You are dizzy or light-headed. °· You faint. °· You have nausea or vomiting. °· You have chills. °· You have a fever. °· You have severe abdominal pain. °· You have black, tarry, or bloody stools. °This information is not intended to replace advice given to you by your health care provider. Make sure you discuss any questions you have with your health care provider. °Document Released: 11/14/2012 Document  Revised: 05/05/2016 Document Reviewed: 10/22/2015 °Elsevier Interactive Patient Education © 2018 [employer] ° °

## 2018-09-04 NOTE — Op Note (Signed)
[facility] Patient Name: Cheryl Schneider Procedure Date: 09/04/2018 MRN: [id number] Attending MD: Shirley Friar , MD Date of Birth: 11-15-1936 CSN: [id number] Age: 82 Admit Type: Outpatient Procedure:                Upper GI endoscopy Indications:              Acute gastric ulcer with hemorrhage Providers:                Shirley Friar, MD, Norman Clay, RN, Zoila Shutter, Technician, Albertina Senegal. Alday CRNA, CRNA Referring MD:             Merri Brunette Medicines:                Propofol per Anesthesia, Monitored Anesthesia Care Complications:            No immediate complications. Estimated Blood Loss:     Estimated blood loss: none. Procedure:                Pre-Anesthesia Assessment:                           - Prior to the procedure, a History and Physical                            was performed, and patient medications and                            allergies were reviewed. The patient's tolerance of                            previous anesthesia was also reviewed. The risks                            and benefits of the procedure and the sedation                            options and risks were discussed with the patient.                            All questions were answered, and informed consent                            was obtained. Prior Anticoagulants: The patient has                            taken no previous anticoagulant or antiplatelet                            agents. ASA Grade Assessment: II - A patient with                            mild systemic disease. After reviewing the risks  and benefits, the patient was deemed in                            satisfactory condition to undergo the procedure.                           After obtaining informed consent, the endoscope was                            passed under direct vision. Throughout the                            procedure, the patient's  blood pressure, pulse, and                            oxygen saturations were monitored continuously. The                            [id number]-H190 ([id number]) Olympus adult endoscope was                            introduced through the mouth, and advanced to the                            second part of duodenum. The upper GI endoscopy was                            accomplished without difficulty. The patient                            tolerated the procedure well. Scope In: Scope Out: Findings:      The examined esophagus was normal.      The Z-line was regular and was found 36 cm from the incisors.      A small hiatal hernia was present.      Patchy mild inflammation characterized by congestion (edema), erosions       and erythema was found in the gastric antrum.      There is no endoscopic evidence of ulceration in the entire examined       stomach.      A small hiatal hernia was present.      The examined duodenum was normal. Impression:               - Normal esophagus.                           - Z-line regular, 36 cm from the incisors.                           - Small hiatal hernia.                           - Acute gastritis.                           - Small hiatal hernia.                           -  Normal examined duodenum.                           - No specimens collected. Moderate Sedation:      N/A- Per Anesthesia Care Recommendation:           - Patient has a contact number available for                            emergencies. The signs and symptoms of potential                            delayed complications were discussed with the                            patient. Return to normal activities tomorrow.                            Written discharge instructions were provided to the                            patient.                           - Resume previous diet.                           - Continue present medications.                           - No ibuprofen,  naproxen, or other non-steroidal                            anti-inflammatory drugs.                           - Use Protonix (pantoprazole) 40 mg PO daily. Procedure Code(s):        --- Professional ---                           [phone], Esophagogastroduodenoscopy, flexible,                            transoral; diagnostic, including collection of                            specimen(s) by brushing or washing, when performed                            (separate procedure) Diagnosis Code(s):        --- Professional ---                           K29.00, Acute gastritis without bleeding                           K25.0, Acute gastric ulcer with hemorrhage  K44.9, Diaphragmatic hernia without obstruction or                            gangrene CPT copyright 2017 [employer]. All rights reserved. The codes documented in this report are preliminary and upon coder review may  be revised to meet current compliance requirements. Shirley Friar, MD 09/04/2018 9:17:25 AM This report has been signed electronically. Number of Addenda: 0

## 2018-09-04 NOTE — Interval H&P Note (Signed)
History and Physical Interval Note:  09/04/2018 8:04 AM  Cheryl Schneider  has presented today for surgery, with the diagnosis of Acute gastric ulcer  The various methods of treatment have been discussed with the patient and family. After consideration of risks, benefits and other options for treatment, the patient has consented to  Procedure(s): ESOPHAGOGASTRODUODENOSCOPY (EGD) WITH PROPOFOL (N/A) as a surgical intervention .  The patient's history has been reviewed, patient examined, no change in status, stable for surgery.  I have reviewed the patient's chart and labs.  Questions were answered to the patient's satisfaction.     [name]

## 2018-09-04 NOTE — Anesthesia Preprocedure Evaluation (Signed)
Anesthesia Evaluation  Patient identified by MRN, date of birth, ID band Patient awake    Reviewed: Allergy & Precautions, NPO status , Patient's Chart, lab work & pertinent test results  Airway Mallampati: II  TM Distance: >3 FB     Dental   Pulmonary neg pulmonary ROS,    breath sounds clear to auscultation       Cardiovascular negative cardio ROS   Rhythm:Regular Rate:Normal     Neuro/Psych    GI/Hepatic Neg liver ROS, PUD,   Endo/Other  Hypothyroidism   Renal/GU negative Renal ROS     Musculoskeletal   Abdominal   Peds  Hematology   Anesthesia Other Findings   Reproductive/Obstetrics                             Anesthesia Physical Anesthesia Plan  ASA: III  Anesthesia Plan: MAC   Post-op Pain Management:    Induction: Intravenous  PONV Risk Score and Plan: 2 and Treatment may vary due to age or medical condition and Propofol infusion  Airway Management Planned: Simple Face Mask and Nasal Cannula  Additional Equipment:   Intra-op Plan:   Post-operative Plan:   Informed Consent: I have reviewed the patients History and Physical, chart, labs and discussed the procedure including the risks, benefits and alternatives for the proposed anesthesia with the patient or authorized representative who has indicated his/her understanding and acceptance.   Dental advisory given  Plan Discussed with: Anesthesiologist and CRNA  Anesthesia Plan Comments:         Anesthesia Quick Evaluation

## 2018-09-07 ENCOUNTER — Encounter (HOSPITAL_COMMUNITY): Payer: Self-pay | Admitting: Gastroenterology

## 2019-08-09 ENCOUNTER — Other Ambulatory Visit: Payer: Self-pay | Admitting: Registered Nurse

## 2019-08-09 DIAGNOSIS — Z1231 Encounter for screening mammogram for malignant neoplasm of breast: Secondary | ICD-10-CM

## 2019-09-24 ENCOUNTER — Other Ambulatory Visit: Payer: Self-pay

## 2019-09-24 ENCOUNTER — Ambulatory Visit
Admission: RE | Admit: 2019-09-24 | Discharge: 2019-09-24 | Disposition: A | Discharge status: Home / Self Care | Payer: Medicare Other | Source: Ambulatory Visit | Attending: Registered Nurse | Admitting: Registered Nurse

## 2019-09-24 DIAGNOSIS — Z1231 Encounter for screening mammogram for malignant neoplasm of breast: Secondary | ICD-10-CM

## 2019-12-16 DIAGNOSIS — H26493 Other secondary cataract, bilateral: Secondary | ICD-10-CM | POA: Diagnosis not present

## 2019-12-16 DIAGNOSIS — H52203 Unspecified astigmatism, bilateral: Secondary | ICD-10-CM | POA: Diagnosis not present

## 2020-02-11 DIAGNOSIS — K279 Peptic ulcer, site unspecified, unspecified as acute or chronic, without hemorrhage or perforation: Secondary | ICD-10-CM | POA: Diagnosis not present

## 2020-04-06 DIAGNOSIS — H6123 Impacted cerumen, bilateral: Secondary | ICD-10-CM | POA: Diagnosis not present

## 2020-04-06 DIAGNOSIS — F430 Acute stress reaction: Secondary | ICD-10-CM | POA: Diagnosis not present

## 2020-04-06 DIAGNOSIS — F411 Generalized anxiety disorder: Secondary | ICD-10-CM | POA: Diagnosis not present

## 2020-04-06 DIAGNOSIS — R030 Elevated blood-pressure reading, without diagnosis of hypertension: Secondary | ICD-10-CM | POA: Diagnosis not present

## 2020-04-20 DIAGNOSIS — F419 Anxiety disorder, unspecified: Secondary | ICD-10-CM | POA: Diagnosis not present

## 2020-05-20 DIAGNOSIS — E7800 Pure hypercholesterolemia, unspecified: Secondary | ICD-10-CM | POA: Diagnosis not present

## 2020-05-20 DIAGNOSIS — E559 Vitamin D deficiency, unspecified: Secondary | ICD-10-CM | POA: Diagnosis not present

## 2020-05-20 DIAGNOSIS — N390 Urinary tract infection, site not specified: Secondary | ICD-10-CM | POA: Diagnosis not present

## 2020-05-20 DIAGNOSIS — M810 Age-related osteoporosis without current pathological fracture: Secondary | ICD-10-CM | POA: Diagnosis not present

## 2020-05-20 DIAGNOSIS — Z0000 Encounter for general adult medical examination without abnormal findings: Secondary | ICD-10-CM | POA: Diagnosis not present

## 2020-05-20 DIAGNOSIS — E039 Hypothyroidism, unspecified: Secondary | ICD-10-CM | POA: Diagnosis not present

## 2020-05-26 DIAGNOSIS — K219 Gastro-esophageal reflux disease without esophagitis: Secondary | ICD-10-CM | POA: Diagnosis not present

## 2020-05-26 DIAGNOSIS — Z0000 Encounter for general adult medical examination without abnormal findings: Secondary | ICD-10-CM | POA: Diagnosis not present

## 2020-05-26 DIAGNOSIS — E039 Hypothyroidism, unspecified: Secondary | ICD-10-CM | POA: Diagnosis not present

## 2020-05-26 DIAGNOSIS — E7800 Pure hypercholesterolemia, unspecified: Secondary | ICD-10-CM | POA: Diagnosis not present

## 2020-08-18 ENCOUNTER — Other Ambulatory Visit: Payer: Self-pay | Admitting: Registered Nurse

## 2020-08-18 DIAGNOSIS — Z1231 Encounter for screening mammogram for malignant neoplasm of breast: Secondary | ICD-10-CM

## 2020-09-24 ENCOUNTER — Ambulatory Visit: Payer: Medicare Other

## 2020-10-28 ENCOUNTER — Other Ambulatory Visit: Payer: Self-pay

## 2020-10-28 ENCOUNTER — Ambulatory Visit
Admission: RE | Admit: 2020-10-28 | Discharge: 2020-10-28 | Disposition: A | Discharge status: Home / Self Care | Payer: Medicare PPO | Source: Ambulatory Visit | Attending: Registered Nurse | Admitting: Registered Nurse

## 2020-10-28 DIAGNOSIS — Z1231 Encounter for screening mammogram for malignant neoplasm of breast: Secondary | ICD-10-CM | POA: Diagnosis not present

## 2020-12-17 DIAGNOSIS — H52203 Unspecified astigmatism, bilateral: Secondary | ICD-10-CM | POA: Diagnosis not present

## 2020-12-17 DIAGNOSIS — H26493 Other secondary cataract, bilateral: Secondary | ICD-10-CM | POA: Diagnosis not present

## 2020-12-17 DIAGNOSIS — Z961 Presence of intraocular lens: Secondary | ICD-10-CM | POA: Diagnosis not present

## 2021-03-08 ENCOUNTER — Emergency Department (HOSPITAL_BASED_OUTPATIENT_CLINIC_OR_DEPARTMENT_OTHER)
Admission: EM | Admit: 2021-03-08 | Discharge: 2021-03-08 | Disposition: A | Discharge status: Home / Self Care | Payer: Medicare PPO | Attending: Emergency Medicine | Admitting: Emergency Medicine

## 2021-03-08 ENCOUNTER — Other Ambulatory Visit: Payer: Self-pay

## 2021-03-08 ENCOUNTER — Encounter (HOSPITAL_BASED_OUTPATIENT_CLINIC_OR_DEPARTMENT_OTHER): Payer: Self-pay

## 2021-03-08 ENCOUNTER — Other Ambulatory Visit (HOSPITAL_COMMUNITY): Payer: Self-pay | Admitting: Physician Assistant

## 2021-03-08 DIAGNOSIS — E039 Hypothyroidism, unspecified: Secondary | ICD-10-CM | POA: Diagnosis not present

## 2021-03-08 DIAGNOSIS — M79622 Pain in left upper arm: Secondary | ICD-10-CM | POA: Insufficient documentation

## 2021-03-08 DIAGNOSIS — Z79899 Other long term (current) drug therapy: Secondary | ICD-10-CM | POA: Insufficient documentation

## 2021-03-08 DIAGNOSIS — M5410 Radiculopathy, site unspecified: Secondary | ICD-10-CM | POA: Insufficient documentation

## 2021-03-08 DIAGNOSIS — M79621 Pain in right upper arm: Secondary | ICD-10-CM | POA: Diagnosis present

## 2021-03-08 MED ORDER — PREDNISONE 10 MG PO TABS: 20.0000 mg | ORAL_TABLET | Freq: Every day | ORAL | 0 refills | Status: AC

## 2021-03-08 MED FILL — predniSONE 10 MG TABS: 10 | 4 days supply | Qty: 8 | Fill #0

## 2021-03-08 NOTE — ED Triage Notes (Signed)
Bilateral numbness/tingling to upper extremities without any other sxs x 3 days.  Pt states she has tried OTC creams to help with the burning but has not had any relief.  Denies CP/SOB/covid contacts

## 2021-03-08 NOTE — Discharge Instructions (Addendum)
Like we discussed, I am going to prescribe you a steroid medication, prednisone.  You can take 20 mg in the morning for the next 4 days.  This will hopefully help with your pain in the upper arms.  Please also consider taking extra strength Tylenol.  You can follow the instructions on the bottle for dosing.  If your symptoms do not improve over the next few days, please follow-up with your regular doctor for further evaluation.  You can always return to the emergency department if your symptoms worsen.  It was a pleasure to meet you.

## 2021-03-08 NOTE — ED Provider Notes (Signed)
[facility] EMERGENCY DEPARTMENT Provider Note   CSN: [id number] Arrival date & time: 03/08/21  1105     History Chief Complaint  Patient presents with  . Numbness    Cheryl Schneider is a 85 y.o. female.  HPI Patient is an 85 year old female with a history of hypothyroidism, osteoporosis, PUD, who presents the emergency department due to pain in the bilateral arms.  Patient states about 3 days ago she began experiencing a burning pain in the bilateral brachial regions of the upper extremities.  Denies any joint pain, weakness, or numbness.  She states that she is independent and able to manage all of her ADLs and has been doing so since the onset of her pain.  She states that the pain is "aggravating and I just want to know what it is".  She has tried OTC salves and topical creams without any significant relief.  She states her symptoms today are slightly better than yesterday.  No chest pain, shortness of breath, abdominal pain, nausea, vomiting, URI symptoms.    Past Medical History:  Diagnosis Date  . Hypothyroidism 05/01/2018  . Osteoporosis 05/01/2018  . Peptic ulcer disease 05/01/2018  . Syncope 05/01/2018  . Thyroid disease     Patient Active Problem List   Diagnosis Date Noted  . Hypothyroidism 05/01/2018  . Syncope 05/01/2018  . GIB (gastrointestinal bleeding) 05/01/2018  . Peptic ulcer disease 05/01/2018  . Osteoporosis 05/01/2018    Past Surgical History:  Procedure Laterality Date  . ESOPHAGOGASTRODUODENOSCOPY (EGD) WITH PROPOFOL N/A 05/02/2018   Procedure: ESOPHAGOGASTRODUODENOSCOPY (EGD) WITH PROPOFOL;  Surgeon: Charlott Rakes, MD;  Location: [facility];  Service: Endoscopy;  Laterality: N/A;  . ESOPHAGOGASTRODUODENOSCOPY (EGD) WITH PROPOFOL N/A 09/04/2018   Procedure: ESOPHAGOGASTRODUODENOSCOPY (EGD) WITH PROPOFOL;  Surgeon: Charlott Rakes, MD;  Location: [facility];  Service: Endoscopy;  Laterality: N/A;  . HERNIA REPAIR       OB History   No  obstetric history on file.     Family History  Problem Relation Age of Onset  . Breast cancer Neg Hx     Social History   Tobacco Use  . Smoking status: Never Smoker  . Smokeless tobacco: Never Used  Vaping Use  . Vaping Use: Never used  Substance Use Topics  . Alcohol use: No  . Drug use: No    Home Medications Prior to Admission medications   Medication Sig Start Date End Date Taking? Authorizing Provider  acetaminophen (TYLENOL) 325 MG tablet Take 650 mg by mouth every 6 (six) hours as needed.   Yes [provider]  alendronate (FOSAMAX) 70 MG tablet Take 1 tablet by mouth once a week. Tuesdays 02/24/18  Yes [provider]  Calcium Carbonate-Vitamin D 600-400 MG-UNIT tablet Take 1 tablet by mouth 2 (two) times daily.    Yes [provider]  cholecalciferol (VITAMIN D) 1000 units tablet Take 1,000 Units by mouth daily.   Yes [provider]  Coenzyme Q10 (COQ-10) 10 MG CAPS Take 1 tablet by mouth 2 (two) times daily.   Yes [provider]  cyanocobalamin 1000 MCG tablet Take 1,000 mcg by mouth daily.   Yes [provider]  Garlic 100 MG TABS Take 100 mg by mouth daily.   Yes [provider]  levothyroxine (SYNTHROID, LEVOTHROID) 50 MCG tablet Take 1 tablet by mouth daily before breakfast.    Yes [provider]  Melatonin 3 MG TABS Take 1 tablet by mouth at bedtime.   Yes [provider]  Multiple Vitamins-Minerals (MULTIVITAMIN WITH MINERALS) tablet Take 2 tablets by mouth daily.   Yes [provider]  Omega-3 Fatty Acids (FISH OIL) 1000 MG CPDR Take 1,000 mg by mouth daily.   Yes [provider]  predniSONE (DELTASONE) 10 MG tablet Take 2 tablets (20 mg total) by mouth daily for 4 days. 03/08/21 03/12/21 Yes Placido Sou, PA-C  pantoprazole (PROTONIX) 40 MG tablet Take 1 tablet (40 mg total) by mouth 2 (two) times daily. 05/03/18 08/28/18  Burnadette Pop, MD    Allergies     Patient has no known allergies.  Review of Systems   Review of Systems  All other systems reviewed and are negative. Ten systems reviewed and are negative for acute change, except as noted in the HPI.   Physical Exam Updated Vital Signs BP (!) 142/77 (BP Location: Left Arm)   Pulse 79   Temp 97.7 F (36.5 C) (Oral)   Resp 18   Ht 5\' 5"  (1.651 m)   Wt 79.4 kg   SpO2 97%   BMI 29.12 kg/m   Physical Exam Vitals and nursing note reviewed.  Constitutional:      General: She is not in acute distress.    Appearance: Normal appearance. She is not ill-appearing, toxic-appearing or diaphoretic.  HENT:     Head: Normocephalic and atraumatic.     Right Ear: External ear normal.     Left Ear: External ear normal.     Nose: Nose normal.     Mouth/Throat:     Mouth: Mucous membranes are moist.     Pharynx: Oropharynx is clear. No oropharyngeal exudate or posterior oropharyngeal erythema.  Eyes:     General: No scleral icterus.       Right eye: No discharge.        Left eye: No discharge.     Extraocular Movements: Extraocular movements intact.     Conjunctiva/sclera: Conjunctivae normal.  Cardiovascular:     Rate and Rhythm: Normal rate and regular rhythm.     Pulses: Normal pulses.     Heart sounds: Normal heart sounds. No murmur heard. No friction rub. No gallop.   Pulmonary:     Effort: Pulmonary effort is normal. No respiratory distress.     Breath sounds: Normal breath sounds. No stridor. No wheezing, rhonchi or rales.  Abdominal:     General: Abdomen is flat.     Palpations: Abdomen is soft.     Tenderness: There is no abdominal tenderness.  Musculoskeletal:        General: No tenderness. Normal range of motion.     Cervical back: Normal range of motion and neck supple. No tenderness.     Comments: No palpable pain appreciated in the upper extremities.  Full range of motion of the shoulders, elbows, and wrists.  No worsening of her pain with range of motion of the upper  extremities.  No swelling or overlying skin changes concerning for infection.  Skin:    General: Skin is warm and dry.  Neurological:     General: No focal deficit present.     Mental Status: She is alert and oriented to person, place, and time.     Comments: Patient is oriented to person, place, and time. Patient phonates in clear, complete, and coherent sentences. Strength is 5/5 in all four extremities. Distal sensation intact in all four extremities.  Psychiatric:        Mood and Affect: Mood normal.  Behavior: Behavior normal.    ED Results / Procedures / Treatments   Labs (all labs ordered are listed, but only abnormal results are displayed) Labs Reviewed - No data to display  EKG EKG Interpretation  Date/Time:  Monday March 08 2021 11:16:21 EDT Ventricular Rate:  99 PR Interval:    QRS Duration: 104 QT Interval:  370 QTC Calculation: 440 R Axis:   14 Text Interpretation: Sinus rhythm new Multiform ventricular premature complexes Low voltage, precordial leads Confirmed by Gwyneth Sprout ([id number]) on 03/08/2021 11:48:27 AM   Radiology No results found.  Procedures Procedures   Medications Ordered in ED Medications - No data to display  ED Course  I have reviewed the triage vital signs and the nursing notes.  Pertinent labs & imaging results that were available during my care of the patient were reviewed by me and considered in my medical decision making (see chart for details).    MDM Rules/Calculators/A&P                          Patient is an 85 year old female who presents the emergency department due to burning pain in the bilateral upper arms.  Patient is independent and states that she manages all of her ADLs.  She has had no difficulty with her ADLs since her symptoms started.  Denies any recent falls or trauma.  Her physical exam is extremely reassuring.  She has no palpable pain or overlying skin changes.  No symptoms concerning for infection.   Full range of motion of the bilateral upper extremities.  Strength is intact.  No weakness.  No gross deficits.  Patient discussed with and evaluated by my attending physician Dr. Gwyneth Sprout. We both feel that this is possibly a radiculopathy.  She has no neck or back pain.  No midline spine pain.  Again, no deficits in the arms or legs.  Patient does have a history of PUD, so we will discharge her on a low-dose of prednisone for the next 4 days.  She is taking a PPI as well.  Also recommended extra strength Tylenol for management of her pain throughout the day.  We discussed dosing.  If her symptoms do not improve, recommended PCP follow-up.  She verbalized understanding of the above plan.  Her questions were answered and she was amicable at the time of discharge.  Final Clinical Impression(s) / ED Diagnoses Final diagnoses:  Radiculopathy, unspecified spinal region   Rx / DC Orders ED Discharge Orders         Ordered    predniSONE (DELTASONE) 10 MG tablet  Daily        03/08/21 1201           Placido Sou, PA-C 03/08/21 1205    Gwyneth Sprout, MD 03/09/21 1335

## 2021-05-25 DIAGNOSIS — E039 Hypothyroidism, unspecified: Secondary | ICD-10-CM | POA: Diagnosis not present

## 2021-05-25 DIAGNOSIS — E669 Obesity, unspecified: Secondary | ICD-10-CM | POA: Diagnosis not present

## 2021-05-25 DIAGNOSIS — Z0000 Encounter for general adult medical examination without abnormal findings: Secondary | ICD-10-CM | POA: Diagnosis not present

## 2021-05-25 DIAGNOSIS — I10 Essential (primary) hypertension: Secondary | ICD-10-CM | POA: Diagnosis not present

## 2021-05-25 DIAGNOSIS — E559 Vitamin D deficiency, unspecified: Secondary | ICD-10-CM | POA: Diagnosis not present

## 2021-05-25 DIAGNOSIS — E7801 Familial hypercholesterolemia: Secondary | ICD-10-CM | POA: Diagnosis not present

## 2021-05-25 DIAGNOSIS — M810 Age-related osteoporosis without current pathological fracture: Secondary | ICD-10-CM | POA: Diagnosis not present

## 2021-06-01 DIAGNOSIS — E559 Vitamin D deficiency, unspecified: Secondary | ICD-10-CM | POA: Diagnosis not present

## 2021-06-01 DIAGNOSIS — Z0000 Encounter for general adult medical examination without abnormal findings: Secondary | ICD-10-CM | POA: Diagnosis not present

## 2021-06-01 DIAGNOSIS — E7801 Familial hypercholesterolemia: Secondary | ICD-10-CM | POA: Diagnosis not present

## 2021-06-01 DIAGNOSIS — E039 Hypothyroidism, unspecified: Secondary | ICD-10-CM | POA: Diagnosis not present

## 2021-06-01 DIAGNOSIS — E669 Obesity, unspecified: Secondary | ICD-10-CM | POA: Diagnosis not present

## 2021-06-01 DIAGNOSIS — E7800 Pure hypercholesterolemia, unspecified: Secondary | ICD-10-CM | POA: Diagnosis not present

## 2021-06-01 DIAGNOSIS — I10 Essential (primary) hypertension: Secondary | ICD-10-CM | POA: Diagnosis not present

## 2021-06-01 DIAGNOSIS — M810 Age-related osteoporosis without current pathological fracture: Secondary | ICD-10-CM | POA: Diagnosis not present

## 2021-06-01 DIAGNOSIS — K219 Gastro-esophageal reflux disease without esophagitis: Secondary | ICD-10-CM | POA: Diagnosis not present

## 2021-07-09 DIAGNOSIS — K279 Peptic ulcer, site unspecified, unspecified as acute or chronic, without hemorrhage or perforation: Secondary | ICD-10-CM | POA: Diagnosis not present

## 2021-09-20 ENCOUNTER — Other Ambulatory Visit: Payer: Self-pay | Admitting: Registered Nurse

## 2021-09-20 DIAGNOSIS — Z1231 Encounter for screening mammogram for malignant neoplasm of breast: Secondary | ICD-10-CM

## 2021-11-01 ENCOUNTER — Other Ambulatory Visit: Payer: Self-pay

## 2021-11-01 ENCOUNTER — Ambulatory Visit
Admission: RE | Admit: 2021-11-01 | Discharge: 2021-11-01 | Disposition: A | Discharge status: Home / Self Care | Payer: Medicare PPO | Source: Ambulatory Visit | Attending: Registered Nurse | Admitting: Registered Nurse

## 2021-11-01 DIAGNOSIS — Z1231 Encounter for screening mammogram for malignant neoplasm of breast: Secondary | ICD-10-CM

## 2021-12-16 DIAGNOSIS — H524 Presbyopia: Secondary | ICD-10-CM | POA: Diagnosis not present

## 2021-12-16 DIAGNOSIS — H52203 Unspecified astigmatism, bilateral: Secondary | ICD-10-CM | POA: Diagnosis not present

## 2021-12-16 DIAGNOSIS — H26493 Other secondary cataract, bilateral: Secondary | ICD-10-CM | POA: Diagnosis not present

## 2021-12-16 DIAGNOSIS — Z961 Presence of intraocular lens: Secondary | ICD-10-CM | POA: Diagnosis not present

## 2022-06-01 DIAGNOSIS — E039 Hypothyroidism, unspecified: Secondary | ICD-10-CM | POA: Diagnosis not present

## 2022-06-01 DIAGNOSIS — Z0000 Encounter for general adult medical examination without abnormal findings: Secondary | ICD-10-CM | POA: Diagnosis not present

## 2022-06-01 DIAGNOSIS — E7801 Familial hypercholesterolemia: Secondary | ICD-10-CM | POA: Diagnosis not present

## 2022-06-08 DIAGNOSIS — Z0000 Encounter for general adult medical examination without abnormal findings: Secondary | ICD-10-CM | POA: Diagnosis not present

## 2022-06-08 DIAGNOSIS — M810 Age-related osteoporosis without current pathological fracture: Secondary | ICD-10-CM | POA: Diagnosis not present

## 2022-06-08 DIAGNOSIS — E039 Hypothyroidism, unspecified: Secondary | ICD-10-CM | POA: Diagnosis not present

## 2022-06-08 DIAGNOSIS — E559 Vitamin D deficiency, unspecified: Secondary | ICD-10-CM | POA: Diagnosis not present

## 2022-06-08 DIAGNOSIS — E7800 Pure hypercholesterolemia, unspecified: Secondary | ICD-10-CM | POA: Diagnosis not present

## 2022-07-06 DIAGNOSIS — R030 Elevated blood-pressure reading, without diagnosis of hypertension: Secondary | ICD-10-CM | POA: Diagnosis not present

## 2022-07-06 DIAGNOSIS — M810 Age-related osteoporosis without current pathological fracture: Secondary | ICD-10-CM | POA: Diagnosis not present

## 2022-07-06 DIAGNOSIS — M79604 Pain in right leg: Secondary | ICD-10-CM | POA: Diagnosis not present

## 2022-07-06 DIAGNOSIS — M79605 Pain in left leg: Secondary | ICD-10-CM | POA: Diagnosis not present

## 2022-08-18 DIAGNOSIS — M545 Low back pain, unspecified: Secondary | ICD-10-CM | POA: Diagnosis not present

## 2022-09-23 ENCOUNTER — Other Ambulatory Visit: Payer: Self-pay | Admitting: Registered Nurse

## 2022-09-23 DIAGNOSIS — Z1231 Encounter for screening mammogram for malignant neoplasm of breast: Secondary | ICD-10-CM

## 2022-11-02 ENCOUNTER — Ambulatory Visit
Admission: RE | Admit: 2022-11-02 | Discharge: 2022-11-02 | Disposition: A | Discharge status: Home / Self Care | Payer: Medicare PPO | Source: Ambulatory Visit | Attending: Registered Nurse | Admitting: Registered Nurse

## 2022-11-02 DIAGNOSIS — Z1231 Encounter for screening mammogram for malignant neoplasm of breast: Secondary | ICD-10-CM | POA: Diagnosis not present

## 2022-12-26 DIAGNOSIS — H26493 Other secondary cataract, bilateral: Secondary | ICD-10-CM | POA: Diagnosis not present

## 2022-12-26 DIAGNOSIS — Z961 Presence of intraocular lens: Secondary | ICD-10-CM | POA: Diagnosis not present

## 2023-02-01 ENCOUNTER — Ambulatory Visit
Admission: EM | Admit: 2023-02-01 | Discharge: 2023-02-01 | Disposition: A | Discharge status: Home / Self Care | Payer: Medicare PPO | Attending: Family Medicine | Admitting: Family Medicine

## 2023-02-01 DIAGNOSIS — R030 Elevated blood-pressure reading, without diagnosis of hypertension: Secondary | ICD-10-CM

## 2023-02-01 DIAGNOSIS — L509 Urticaria, unspecified: Secondary | ICD-10-CM | POA: Diagnosis not present

## 2023-02-01 MED ORDER — PREDNISONE 20 MG PO TABS: 40.0000 mg | ORAL_TABLET | Freq: Every day | ORAL | 0 refills | Status: AC

## 2023-02-01 NOTE — Discharge Instructions (Signed)
Your blood pressure was noted to be elevated during your visit today. If you are currently taking medication for high blood pressure, please ensure you are taking this as directed. If you do not have a history of high blood pressure and your blood pressure remains persistently elevated, you may need to begin taking a medication at some point. You may return here within the next few days to recheck if unable to see your primary care provider or if you do not have a one.  BP (!) 172/76 (BP Location: Left Arm)   Pulse 75   Temp 97.7 F (36.5 C) (Oral)   Resp 18   SpO2 96%   BP Readings from Last 3 Encounters:  02/01/23 (!) 172/76  03/08/21 (!) 147/75  09/04/18 (!) 146/83

## 2023-02-01 NOTE — ED Triage Notes (Signed)
Pt c/o pruritic rash to primarily RUE but also has lesions on abd, chest, legs etc onset ~ 2 weeks ago not relieved with hydrocortisone.

## 2023-02-01 NOTE — ED Provider Notes (Signed)
[place]   [id number] 02/01/23 Arrival Time: [id number]  ASSESSMENT & PLAN:  1. Urticaria   2. Elevated blood pressure reading without diagnosis of hypertension    No signs of skin infection.  Meds ordered this encounter  Medications   predniSONE (DELTASONE) 20 MG tablet    Sig: Take 2 tablets (40 mg total) by mouth daily.    Dispense:  14 tablet    Refill:  0     Discharge Instructions      Your blood pressure was noted to be elevated during your visit today. If you are currently taking medication for high blood pressure, please ensure you are taking this as directed. If you do not have a history of high blood pressure and your blood pressure remains persistently elevated, you may need to begin taking a medication at some point. You may return here within the next few days to recheck if unable to see your primary care provider or if you do not have a one.  BP (!) 172/76 (BP Location: Left Arm)   Pulse 75   Temp 97.7 F (36.5 C) (Oral)   Resp 18   SpO2 96%   BP Readings from Last 3 Encounters:  02/01/23 (!) 172/76  03/08/21 (!) 147/75  09/04/18 (!) 146/83   Recommend:  Follow-up Information     Holland Commons, FNP.   Specialty: Internal Medicine Why: To recheck your blood pressure. Contact information: [place] [address] [phone]                 Will follow up with PCP or here if worsening or failing to improve as anticipated. Reviewed expectations re: course of current medical issues. Questions answered. Outlined signs and symptoms indicating need for more acute intervention. Patient verbalized understanding. After Visit Summary given.   SUBJECTIVE:  Cheryl Schneider is a 87 y.o. female who presents with a skin complaint. Pt c/o pruritic rash to primarily RUE but also has lesions on abd, chest, legs etc onset ~ 2 weeks ago not relieved with hydrocortisone.  No h/o similar. Afebrile. No new  exposures.  Increased blood pressure noted today. Reports that she is not treated for HTN. She reports no chest pain on exertion, no dyspnea on exertion, no swelling of ankles, no orthostatic dizziness or lightheadedness, no orthopnea or paroxysmal nocturnal dyspnea, and no palpitations.    OBJECTIVE: Vitals:   02/01/23 1926  BP: (!) 172/76  Pulse: 75  Resp: 18  Temp: 97.7 F (36.5 C)  TempSrc: Oral  SpO2: 96%    General appearance: alert; no distress HEENT: ; AT Neck: supple with FROM Lungs: unlabored Extremities: no edema; moves all extremities normally Skin: warm and dry; smooth, slightly elevated and erythematous plaques of variable size over her arms mainly; some on trunk Psychological: alert and cooperative; normal mood and affect  No Known Allergies  Past Medical History:  Diagnosis Date   Hypothyroidism 05/01/2018   Osteoporosis 05/01/2018   Peptic ulcer disease 05/01/2018   Syncope 05/01/2018   Thyroid disease    Social History   Socioeconomic History   Marital status: Married    Spouse name: Not on file   Number of children: Not on file   Years of education: Not on file   Highest education level: Not on file  Occupational History   Not on file  Tobacco Use   Smoking status: Never   Smokeless tobacco: Never  Vaping Use   Vaping Use:  Never used  Substance and Sexual Activity   Alcohol use: No   Drug use: No   Sexual activity: Not on file  Other Topics Concern   Not on file  Social History Narrative   Not on file   Social Determinants of Health   Financial Resource Strain: Not on file  Food Insecurity: Not on file  Transportation Needs: Not on file  Physical Activity: Not on file  Stress: Not on file  Social Connections: Not on file  Intimate Partner Violence: Not on file   Family History  Problem Relation Age of Onset   Breast cancer Neg Hx    Past Surgical History:  Procedure Laterality Date   ESOPHAGOGASTRODUODENOSCOPY (EGD) WITH  PROPOFOL N/A 05/02/2018   Procedure: ESOPHAGOGASTRODUODENOSCOPY (EGD) WITH PROPOFOL;  Surgeon: Wilford Corner, MD;  Location: [facility];  Service: Endoscopy;  Laterality: N/A;   ESOPHAGOGASTRODUODENOSCOPY (EGD) WITH PROPOFOL N/A 09/04/2018   Procedure: ESOPHAGOGASTRODUODENOSCOPY (EGD) WITH PROPOFOL;  Surgeon: Wilford Corner, MD;  Location: [facility];  Service: Endoscopy;  Laterality: N/A;   HERNIA REPAIR        Vanessa Kick, MD 02/01/23 [id number]

## 2023-02-10 DIAGNOSIS — F439 Reaction to severe stress, unspecified: Secondary | ICD-10-CM | POA: Diagnosis not present

## 2023-02-10 DIAGNOSIS — L509 Urticaria, unspecified: Secondary | ICD-10-CM | POA: Diagnosis not present

## 2023-02-10 DIAGNOSIS — I10 Essential (primary) hypertension: Secondary | ICD-10-CM | POA: Diagnosis not present

## 2023-02-23 DIAGNOSIS — I10 Essential (primary) hypertension: Secondary | ICD-10-CM | POA: Diagnosis not present

## 2023-02-23 DIAGNOSIS — L509 Urticaria, unspecified: Secondary | ICD-10-CM | POA: Diagnosis not present

## 2023-06-14 DIAGNOSIS — E7800 Pure hypercholesterolemia, unspecified: Secondary | ICD-10-CM | POA: Diagnosis not present

## 2023-06-14 DIAGNOSIS — I10 Essential (primary) hypertension: Secondary | ICD-10-CM | POA: Diagnosis not present

## 2023-06-14 DIAGNOSIS — E559 Vitamin D deficiency, unspecified: Secondary | ICD-10-CM | POA: Diagnosis not present

## 2023-06-14 DIAGNOSIS — E039 Hypothyroidism, unspecified: Secondary | ICD-10-CM | POA: Diagnosis not present

## 2023-06-23 DIAGNOSIS — E559 Vitamin D deficiency, unspecified: Secondary | ICD-10-CM | POA: Diagnosis not present

## 2023-06-23 DIAGNOSIS — E871 Hypo-osmolality and hyponatremia: Secondary | ICD-10-CM | POA: Diagnosis not present

## 2023-06-23 DIAGNOSIS — R8271 Bacteriuria: Secondary | ICD-10-CM | POA: Diagnosis not present

## 2023-06-23 DIAGNOSIS — F5101 Primary insomnia: Secondary | ICD-10-CM | POA: Diagnosis not present

## 2023-06-23 DIAGNOSIS — I10 Essential (primary) hypertension: Secondary | ICD-10-CM | POA: Diagnosis not present

## 2023-06-23 DIAGNOSIS — Z0000 Encounter for general adult medical examination without abnormal findings: Secondary | ICD-10-CM | POA: Diagnosis not present

## 2023-06-29 DIAGNOSIS — R300 Dysuria: Secondary | ICD-10-CM | POA: Diagnosis not present

## 2023-06-29 DIAGNOSIS — E7800 Pure hypercholesterolemia, unspecified: Secondary | ICD-10-CM | POA: Diagnosis not present

## 2023-06-29 DIAGNOSIS — E871 Hypo-osmolality and hyponatremia: Secondary | ICD-10-CM | POA: Diagnosis not present

## 2023-06-29 DIAGNOSIS — F5101 Primary insomnia: Secondary | ICD-10-CM | POA: Diagnosis not present

## 2023-06-29 DIAGNOSIS — I10 Essential (primary) hypertension: Secondary | ICD-10-CM | POA: Diagnosis not present

## 2023-06-30 IMAGING — MG MM DIGITAL SCREENING BILAT W/ TOMO AND CAD
8 series · 8 of 24 positions shown · non-contrast
Comparison: Previous exam(s).

CLINICAL DATA: Screening.

EXAM:
DIGITAL SCREENING BILATERAL MAMMOGRAM WITH TOMOSYNTHESIS AND CAD
TECHNIQUE: Bilateral screening digital craniocaudal and mediolateral oblique
mammograms were obtained. Bilateral screening digital breast
tomosynthesis was performed. The images were evaluated with
computer-aided detection.

[R CC synth-2D]
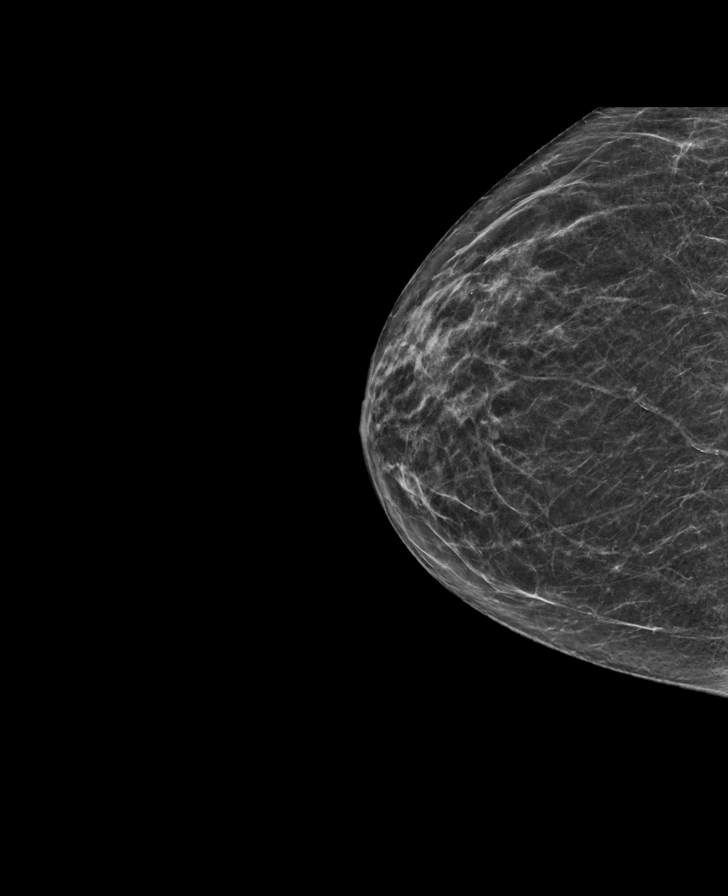

[L MLO synth-2D]
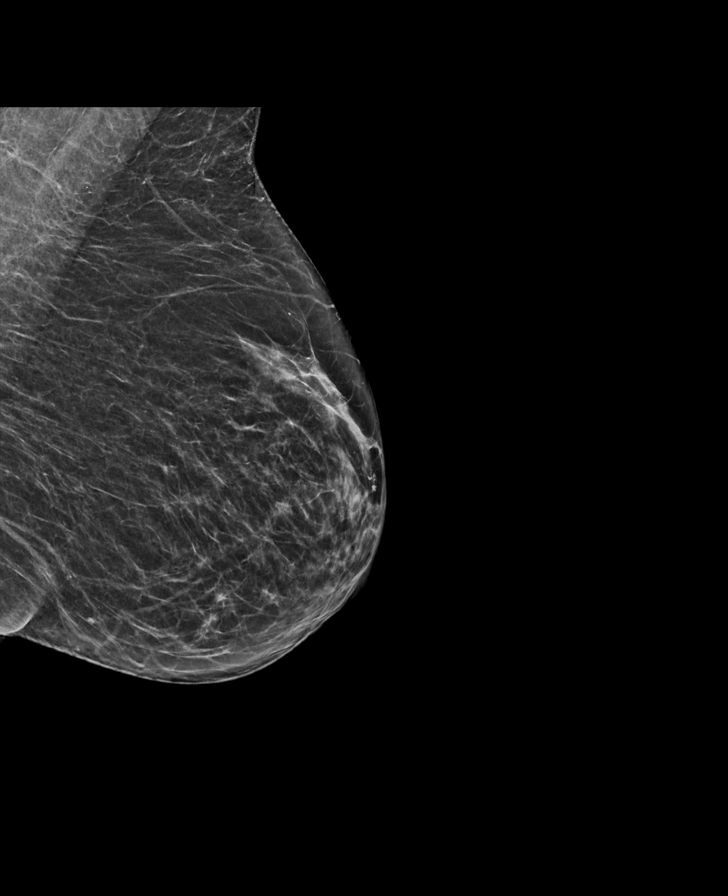

[L CC synth-2D]
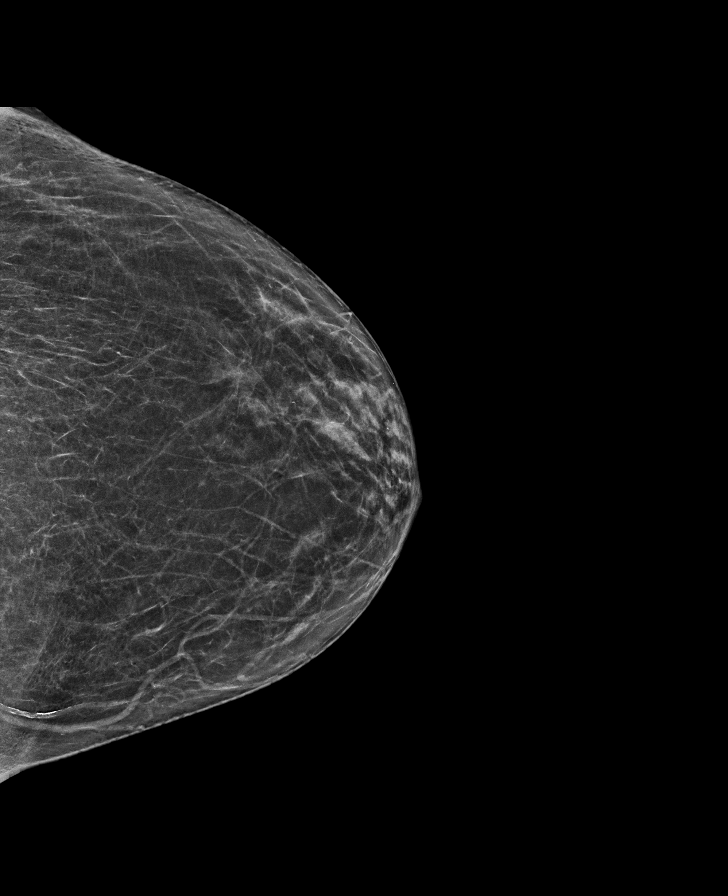

[R MLO synth-2D]
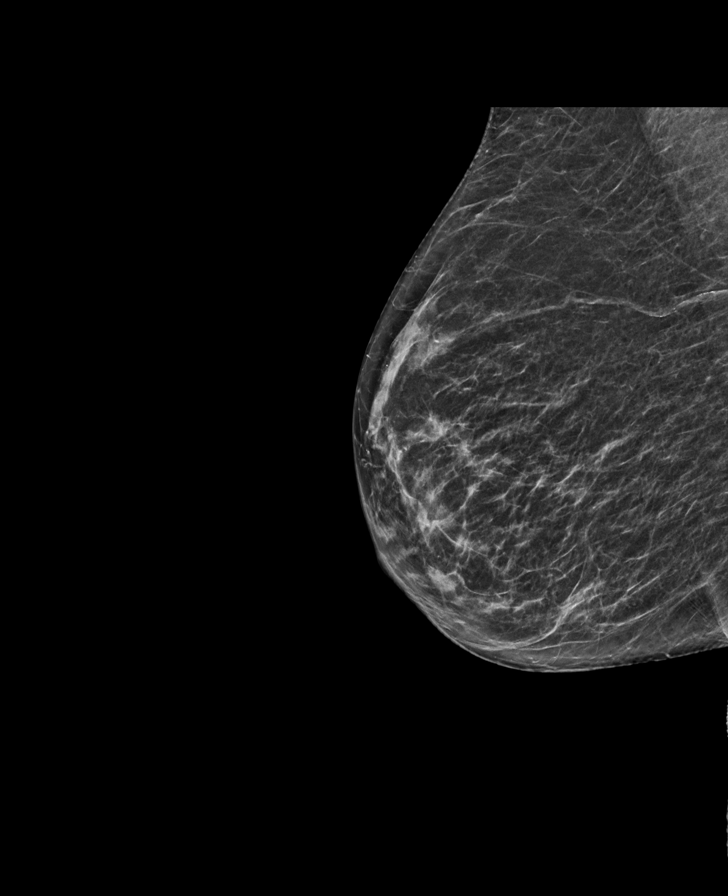

[R MLO tomo · tomo slice 29/57.0]
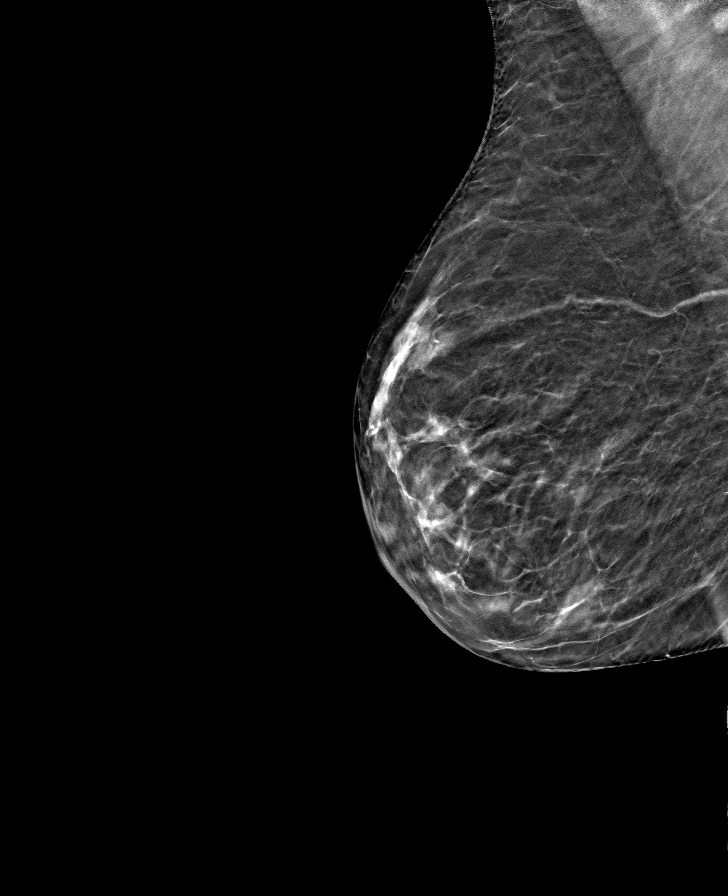

[R CC tomo · tomo slice 29/58.0]
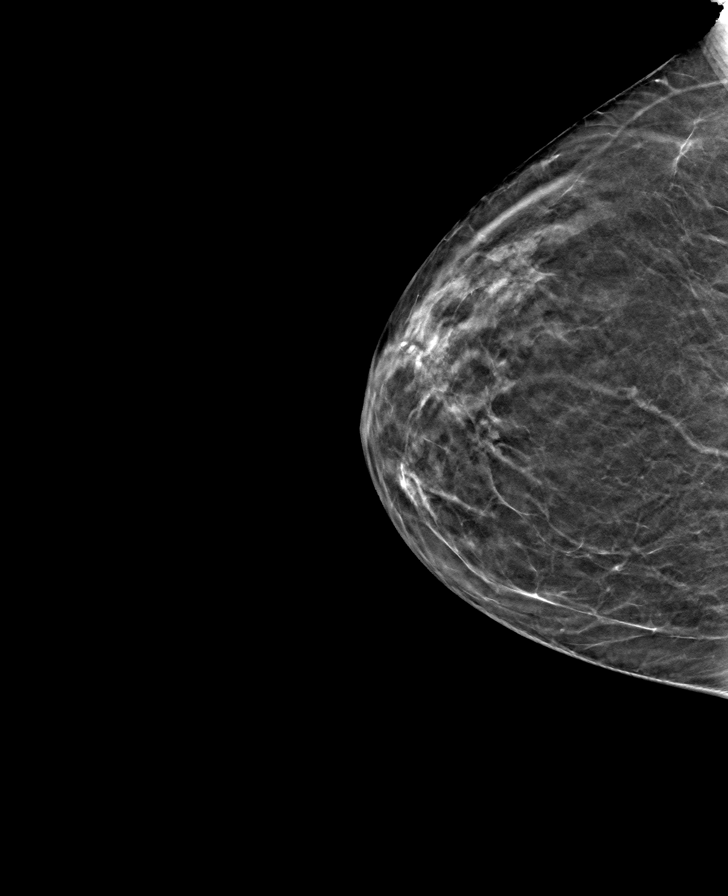

[L MLO tomo · tomo slice 31/61.0]
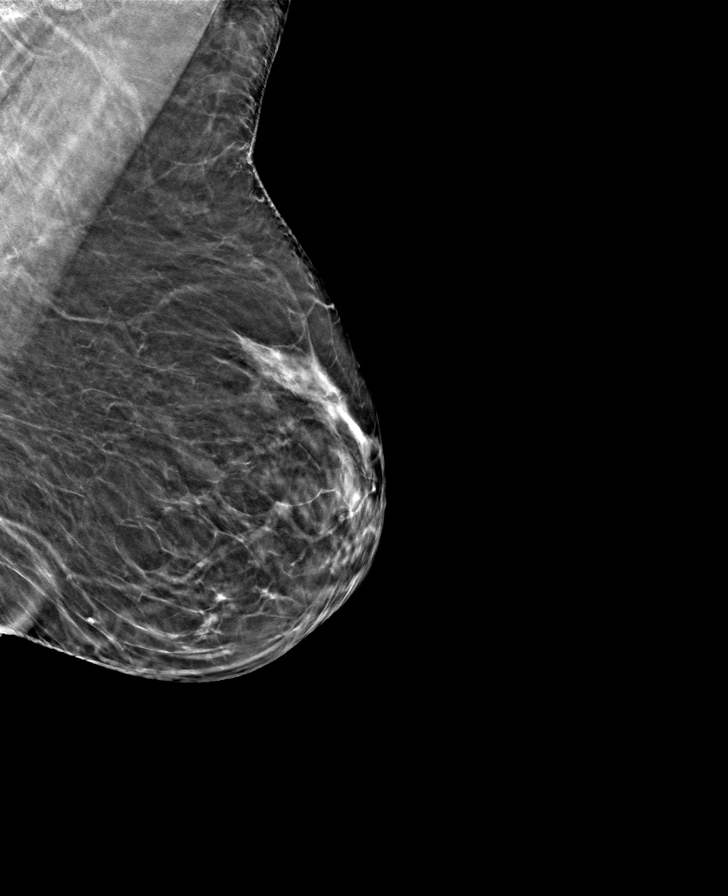

[L CC tomo · tomo slice 30/59.0]
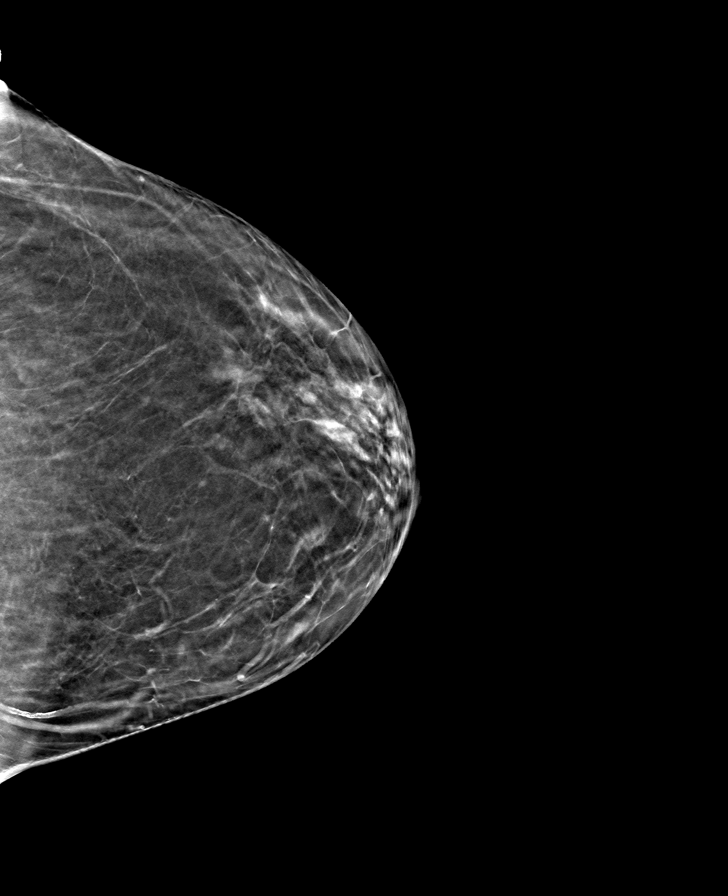

[8 of 24 positions shown; findings below may reference images not displayed]

ACR Breast Density Category b: There are scattered areas of
fibroglandular density.
FINDINGS: There are no findings suspicious for malignancy.
IMPRESSION: No mammographic evidence of malignancy. A result letter of this
screening mammogram will be mailed directly to the patient.

RECOMMENDATION:
Screening mammogram in one year. (Code:51-O-LD2)

BI-RADS CATEGORY  1: Negative.

## 2023-07-27 DIAGNOSIS — H3500 Unspecified background retinopathy: Secondary | ICD-10-CM | POA: Diagnosis not present

## 2023-07-27 DIAGNOSIS — H04123 Dry eye syndrome of bilateral lacrimal glands: Secondary | ICD-10-CM | POA: Diagnosis not present

## 2023-07-27 DIAGNOSIS — H5310 Unspecified subjective visual disturbances: Secondary | ICD-10-CM | POA: Diagnosis not present

## 2023-10-25 DIAGNOSIS — G245 Blepharospasm: Secondary | ICD-10-CM | POA: Diagnosis not present

## 2023-11-30 DIAGNOSIS — Z23 Encounter for immunization: Secondary | ICD-10-CM | POA: Diagnosis not present

## 2023-12-15 ENCOUNTER — Other Ambulatory Visit: Payer: Self-pay | Admitting: Registered Nurse

## 2023-12-15 DIAGNOSIS — Z1231 Encounter for screening mammogram for malignant neoplasm of breast: Secondary | ICD-10-CM

## 2024-01-03 ENCOUNTER — Ambulatory Visit: Payer: Medicare PPO

## 2024-01-26 DIAGNOSIS — H26493 Other secondary cataract, bilateral: Secondary | ICD-10-CM | POA: Diagnosis not present

## 2024-01-26 DIAGNOSIS — Z961 Presence of intraocular lens: Secondary | ICD-10-CM | POA: Diagnosis not present

## 2024-02-29 ENCOUNTER — Ambulatory Visit
Admission: RE | Admit: 2024-02-29 | Discharge: 2024-02-29 | Disposition: A | Discharge status: Home / Self Care | Payer: Medicare PPO | Source: Ambulatory Visit | Attending: Registered Nurse | Admitting: Registered Nurse

## 2024-02-29 DIAGNOSIS — Z1231 Encounter for screening mammogram for malignant neoplasm of breast: Secondary | ICD-10-CM

## 2024-06-27 DIAGNOSIS — I10 Essential (primary) hypertension: Secondary | ICD-10-CM | POA: Diagnosis not present

## 2024-06-27 DIAGNOSIS — E7800 Pure hypercholesterolemia, unspecified: Secondary | ICD-10-CM | POA: Diagnosis not present

## 2024-06-27 DIAGNOSIS — E559 Vitamin D deficiency, unspecified: Secondary | ICD-10-CM | POA: Diagnosis not present

## 2024-06-27 DIAGNOSIS — E039 Hypothyroidism, unspecified: Secondary | ICD-10-CM | POA: Diagnosis not present

## 2024-06-27 DIAGNOSIS — Z0000 Encounter for general adult medical examination without abnormal findings: Secondary | ICD-10-CM | POA: Diagnosis not present

## 2024-07-04 DIAGNOSIS — Z0000 Encounter for general adult medical examination without abnormal findings: Secondary | ICD-10-CM | POA: Diagnosis not present

## 2024-07-04 DIAGNOSIS — I10 Essential (primary) hypertension: Secondary | ICD-10-CM | POA: Diagnosis not present

## 2024-07-04 DIAGNOSIS — E559 Vitamin D deficiency, unspecified: Secondary | ICD-10-CM | POA: Diagnosis not present

## 2024-07-04 DIAGNOSIS — E039 Hypothyroidism, unspecified: Secondary | ICD-10-CM | POA: Diagnosis not present

## 2024-07-04 DIAGNOSIS — E7800 Pure hypercholesterolemia, unspecified: Secondary | ICD-10-CM | POA: Diagnosis not present

## 2024-07-04 DIAGNOSIS — M810 Age-related osteoporosis without current pathological fracture: Secondary | ICD-10-CM | POA: Diagnosis not present
